# Patient Record
Sex: Female | Born: 1970 | Race: White | Hispanic: No | Marital: Married | Smoking: Never smoker
Health system: Southern US, Community
[De-identification: ages and names within clinical notes are randomized; demographics above are authoritative.]

## PROBLEM LIST (undated history)

## (undated) DIAGNOSIS — R5383 Other fatigue: Secondary | ICD-10-CM

## (undated) DIAGNOSIS — C801 Malignant (primary) neoplasm, unspecified: Secondary | ICD-10-CM

## (undated) DIAGNOSIS — I10 Essential (primary) hypertension: Secondary | ICD-10-CM

## (undated) DIAGNOSIS — F419 Anxiety disorder, unspecified: Secondary | ICD-10-CM

## (undated) DIAGNOSIS — N6009 Solitary cyst of unspecified breast: Secondary | ICD-10-CM

## (undated) HISTORY — PX: BLADDER SUSPENSION: SHX72

## (undated) HISTORY — PX: BREAST BIOPSY: SHX20

## (undated) HISTORY — PX: BREAST CYST ASPIRATION: SHX578

## (undated) HISTORY — PX: NOVASURE ABLATION: SHX5394

## (undated) HISTORY — DX: Other fatigue: R53.83

## (undated) HISTORY — DX: Solitary cyst of unspecified breast: N60.09

## (undated) HISTORY — DX: Essential (primary) hypertension: I10

---

## 2008-03-08 ENCOUNTER — Ambulatory Visit: Payer: Self-pay | Admitting: Obstetrics and Gynecology

## 2008-10-19 ENCOUNTER — Ambulatory Visit: Payer: Self-pay | Admitting: Obstetrics and Gynecology

## 2008-10-26 ENCOUNTER — Ambulatory Visit: Payer: Self-pay | Admitting: Obstetrics and Gynecology

## 2012-12-15 ENCOUNTER — Ambulatory Visit: Payer: Self-pay | Admitting: Obstetrics and Gynecology

## 2012-12-21 ENCOUNTER — Ambulatory Visit: Payer: Self-pay | Admitting: Obstetrics and Gynecology

## 2013-02-02 ENCOUNTER — Encounter: Payer: Self-pay | Admitting: General Surgery

## 2013-02-02 ENCOUNTER — Ambulatory Visit (INDEPENDENT_AMBULATORY_CARE_PROVIDER_SITE_OTHER): Payer: 59 | Admitting: General Surgery

## 2013-02-02 ENCOUNTER — Other Ambulatory Visit: Payer: Self-pay

## 2013-02-02 VITALS — BP 130/84 | HR 78 | Resp 14 | Ht 68.0 in | Wt 164.0 lb

## 2013-02-02 DIAGNOSIS — N6001 Solitary cyst of right breast: Secondary | ICD-10-CM

## 2013-02-02 DIAGNOSIS — N6009 Solitary cyst of unspecified breast: Secondary | ICD-10-CM | POA: Insufficient documentation

## 2013-02-02 NOTE — Patient Instructions (Signed)
Patient to return in 6 months for follow up.

## 2013-02-02 NOTE — Progress Notes (Signed)
Patient ID: Grace Moreno, female   DOB: 08-May-1971, 42 y.o.   MRN: [id number]  Chief Complaint  Patient presents with  . abnormal mammogram    New patient category 3     HPI Grace Moreno is a 42 y.o. female who presents as a new patient follow up of an abnormal mammogram. Initial mammogram was done on 12/15/12 with a birad category 0. Additional views of the right breast as well as an ultrasound was done on 12/21/12. Additional views had a birad category 3. She has a family history of breast and ovarian cancer. Patient admits regular self breast checks and has just had her second mammogram done. She denies any problems with her breasts at this time. She has had breast cysts in the the right breast in the past which were aspirated. The patient reports that these lesions were palpable. HPI  Past Medical History  Diagnosis Date  . Breast cyst   . Fatigue   . Hypertension     Past Surgical History  Procedure Laterality Date  . Cesarean section  2000    Family History  Problem Relation Age of Onset  . Lung cancer Father   . Cancer Paternal Aunt     breast  . Cancer Paternal Grandmother     breast  . Cancer Maternal Grandmother     ovarian    Social History History  Substance Use Topics  . Smoking status: Never Smoker   . Smokeless tobacco: Never Used  . Alcohol Use: Yes    No Known Allergies  Current Outpatient Prescriptions  Medication Sig Dispense Refill  . Biotin (BIOTIN 5000) 5 MG CAPS Take by mouth.      . cholecalciferol (VITAMIN D) 1000 UNITS tablet Take 1,000 Units by mouth daily.      . hydrochlorothiazide (HYDRODIURIL) 25 MG tablet Take 25 mg by mouth.      . Multiple Vitamin (MULTIVITAMIN) tablet Take 1 tablet by mouth daily.       No current facility-administered medications for this visit.    Review of Systems Review of Systems  Constitutional: Negative.   Respiratory: Negative.   Cardiovascular: Negative.     Blood pressure 130/84, pulse 78, resp. rate  14, height 5\' 8"  (1.727 m), weight 164 lb (74.39 kg), last menstrual period 01/21/2013.  Physical Exam Physical Exam  Constitutional: She appears well-developed and well-nourished.  Neck: Trachea normal. No mass and no thyromegaly present.  Cardiovascular: Normal rate, regular rhythm, normal heart sounds and normal pulses.   No murmur heard. Pulmonary/Chest: Effort normal and breath sounds normal. Right breast exhibits no inverted nipple, no mass, no nipple discharge, no skin change and no tenderness. Left breast exhibits no inverted nipple, no mass, no nipple discharge, no skin change and no tenderness.  Lymphadenopathy:    She has no cervical adenopathy.    She has no axillary adenopathy.    Data Reviewed Bilateral mammograms date December 15, 2012 showed an asymmetric density in the superior lateral portion of the right breast for which additional views were requested. BI-RAD-0.  Focal spot compression views on December 21, 2012 showed an indeterminate hypoechoic nodules 1:00 position that was smaller than the area noted on mammography. Internal echoes with redundant 5 and a consideration of a complex cyst was to be considered. BI-RAD-3.  Ultrasound examination of the right breast in the lateral aspect was completed. A 0.37 x 0.3 x 0.46 hypoechoic area that was too small to characterize was identified at the o'clock position  3 cm from the nipple. The patient was amenable to FNA sampling. This was completed using 1 cc of 1% plain Xylocaine. Multiple passes through the lesion showed complete resolution and a small amount of fairly clear fluid. Slides were prepared for cytology.    Assessment    Complex cyst right breast.     Plan    Arrangements will be made for a follow up exam in office ultrasound in 6 months.        Earline Mayotte 02/04/2013, 9:29 AM

## 2013-02-04 ENCOUNTER — Encounter: Payer: Self-pay | Admitting: General Surgery

## 2013-02-06 LAB — FINE-NEEDLE ASPIRATION

## 2013-02-07 ENCOUNTER — Telehealth: Payer: Self-pay | Admitting: *Deleted

## 2013-02-07 NOTE — Telephone Encounter (Signed)
Message copied by Currie Paris on Tue Feb 07, 2013  8:30 AM ------      Message from: Grace Moreno      Created: Tue Feb 07, 2013  7:56 AM       Notify patient cytology was as expected: cyst.  F/U in six months as discussed.  ------

## 2013-02-07 NOTE — Progress Notes (Signed)
Quick Note:  Cytology confirms clinical suspicion of a complex cyst. Arrangements in place for a follow up exam and ultrasound in six months. ______

## 2013-02-07 NOTE — Telephone Encounter (Signed)
Notified patient as instructed, patient pleased. Discussed follow-up appointments, patient agrees  

## 2013-06-26 ENCOUNTER — Ambulatory Visit: Payer: Self-pay | Admitting: Obstetrics and Gynecology

## 2013-08-07 ENCOUNTER — Encounter: Payer: Self-pay | Admitting: General Surgery

## 2013-08-07 ENCOUNTER — Ambulatory Visit (INDEPENDENT_AMBULATORY_CARE_PROVIDER_SITE_OTHER): Payer: 59 | Admitting: General Surgery

## 2013-08-07 VITALS — BP 130/80 | HR 76 | Resp 12 | Ht 68.0 in | Wt 176.0 lb

## 2013-08-07 DIAGNOSIS — N6001 Solitary cyst of right breast: Secondary | ICD-10-CM

## 2013-08-07 DIAGNOSIS — N6009 Solitary cyst of unspecified breast: Secondary | ICD-10-CM

## 2013-08-07 NOTE — Progress Notes (Signed)
Patient ID: Grace Moreno, female   DOB: 07-May-1971, 42 y.o.   MRN: [id number]  Chief Complaint  Patient presents with  . Follow-up    mammogram    HPI Grace Moreno is a 42 y.o. female who presents for a breast evaluation 06/26/13 with a birad category 3. She also had a right breast ultrasound done at the same time with a category 3 as well. The most recent mammogram was done on June 25, 2013 . Patient does perform regular self breast checks and gets regular mammograms done. The patient denies any new breast problems at this time.    At the time of her May 2014 exam plans were for a follow up ultrasound at this visit. The patient report she was contacted by [facility] and informed that she needed to have a mammogram and ultrasound completed at their facility, and their facility only. HPI  Past Medical History  Diagnosis Date  . Breast cyst   . Fatigue   . Hypertension     Past Surgical History  Procedure Laterality Date  . Cesarean section  2000    Family History  Problem Relation Age of Onset  . Lung cancer Father   . Cancer Paternal Aunt     breast  . Cancer Paternal Grandmother     breast  . Cancer Maternal Grandmother     ovarian    Social History History  Substance Use Topics  . Smoking status: Never Smoker   . Smokeless tobacco: Never Used  . Alcohol Use: Yes    No Known Allergies  Current Outpatient Prescriptions  Medication Sig Dispense Refill  . Biotin (BIOTIN 5000) 5 MG CAPS Take by mouth.      . cholecalciferol (VITAMIN D) 1000 UNITS tablet Take 1,000 Units by mouth daily.      . hydrochlorothiazide (HYDRODIURIL) 25 MG tablet Take 25 mg by mouth.      . Multiple Vitamin (MULTIVITAMIN) tablet Take 1 tablet by mouth daily.       No current facility-administered medications for this visit.    Review of Systems Review of Systems  Constitutional: Negative.   Respiratory: Negative.   Cardiovascular: Negative.     Blood pressure 130/80,  pulse 76, resp. rate 12, height 5\' 8"  (1.727 m), weight 176 lb (79.833 kg), last menstrual period 08/06/2013.  Physical Exam Physical Exam  Constitutional: She is oriented to person, place, and time. She appears well-developed and well-nourished.  Neck: Neck supple. No thyromegaly present.  Cardiovascular: Normal rate, regular rhythm and normal heart sounds.   No murmur heard. Pulmonary/Chest: Effort normal and breath sounds normal. Right breast exhibits no inverted nipple, no mass, no nipple discharge, no skin change and no tenderness. Left breast exhibits no inverted nipple, no mass, no nipple discharge, no skin change and no tenderness.  Lymphadenopathy:    She has no cervical adenopathy.    She has no axillary adenopathy.  Neurological: She is alert and oriented to person, place, and time.  Skin: Skin is warm and dry.    Data Reviewed  Right breast mammogram dated June 26, 2013 identified stable asymmetries within the right breast. Ultrasound showed a 6 mm oval hypoechoic area unchanged from previous exams.   BI-RAD-3.   Previous office ultrasound had showed an indeterminate lesion, which resolved completely on aspiration.cytology results are noted below.   Notes Recorded by Earline Mayotte, MD on 02/07/2013 at 7:56 AM Cytology confirms clinical suspicion of a complex cyst. Arrangements in place  for a follow up exam and ultrasound in six months.      80mo ago    Specimen type: Comment     Comments: [id number] Fine Needle Aspiration No. of Containers = 2   Source: Comment     Comments: RIGHT BREAST   PATH REPORT.FINAL DX SPEC Comment     Comments: 610.0 ; Solitary cyst of breast   DIAGNOSIS: Comment     Comments: RIGHT BREAST NEGATIVE FOR MALIGNANT CELLS. FOAM CELLS ARE PRESENT. CELLULAR DEGENERATION IS PRESENT. BENIGN CYST FLUID.   PATH REPORT.COMMENTS IMP SPEC Comment     Comments: Suggest follow up as clinically appropriate.   Signed out by: Comment     Comments:  Rod Mae, MD, Pathologist      Assessment    Multiple pressors, asymptomatic.     Plan    The patient should resume annual screening mammograms with her GYN in spring 2014. She was encouraged collar she has any concerns. Follow up otherwise will be on an as needed basis.        Earline Mayotte 08/07/2013, 9:47 PM

## 2013-08-07 NOTE — Patient Instructions (Addendum)
Patient to return as needed. Patient to call with any new questions or concerns.  

## 2013-12-26 ENCOUNTER — Ambulatory Visit: Payer: Self-pay | Admitting: Obstetrics and Gynecology

## 2014-07-23 ENCOUNTER — Encounter: Payer: Self-pay | Admitting: General Surgery

## 2015-02-19 ENCOUNTER — Other Ambulatory Visit: Payer: Self-pay | Admitting: Obstetrics and Gynecology

## 2015-02-19 DIAGNOSIS — N6310 Unspecified lump in the right breast, unspecified quadrant: Secondary | ICD-10-CM

## 2015-02-19 DIAGNOSIS — N6321 Unspecified lump in the left breast, upper outer quadrant: Secondary | ICD-10-CM

## 2015-02-19 DIAGNOSIS — N6315 Unspecified lump in the right breast, overlapping quadrants: Secondary | ICD-10-CM

## 2015-02-27 ENCOUNTER — Ambulatory Visit
Admission: RE | Admit: 2015-02-27 | Discharge: 2015-02-27 | Disposition: A | Discharge status: Home / Self Care | Payer: 59 | Source: Ambulatory Visit | Attending: Obstetrics and Gynecology | Admitting: Obstetrics and Gynecology

## 2015-02-27 DIAGNOSIS — N6321 Unspecified lump in the left breast, upper outer quadrant: Secondary | ICD-10-CM

## 2015-02-27 DIAGNOSIS — N6315 Unspecified lump in the right breast, overlapping quadrants: Secondary | ICD-10-CM

## 2015-02-27 DIAGNOSIS — N6310 Unspecified lump in the right breast, unspecified quadrant: Principal | ICD-10-CM

## 2015-02-27 DIAGNOSIS — N63 Unspecified lump in breast: Secondary | ICD-10-CM | POA: Diagnosis present

## 2015-10-25 ENCOUNTER — Other Ambulatory Visit: Payer: Self-pay | Admitting: Obstetrics and Gynecology

## 2015-10-25 DIAGNOSIS — N63 Unspecified lump in unspecified breast: Secondary | ICD-10-CM

## 2015-10-31 ENCOUNTER — Ambulatory Visit: Admission: RE | Admit: 2015-10-31 | Payer: 59 | Source: Ambulatory Visit

## 2015-10-31 ENCOUNTER — Other Ambulatory Visit: Payer: 59

## 2015-11-13 ENCOUNTER — Ambulatory Visit
Admission: RE | Admit: 2015-11-13 | Discharge: 2015-11-13 | Disposition: A | Discharge status: Home / Self Care | Payer: 59 | Source: Ambulatory Visit | Attending: Obstetrics and Gynecology | Admitting: Obstetrics and Gynecology

## 2015-11-13 ENCOUNTER — Other Ambulatory Visit: Payer: Self-pay | Admitting: Obstetrics and Gynecology

## 2015-11-13 DIAGNOSIS — N63 Unspecified lump in unspecified breast: Secondary | ICD-10-CM

## 2015-11-13 DIAGNOSIS — N6001 Solitary cyst of right breast: Secondary | ICD-10-CM | POA: Insufficient documentation

## 2016-02-21 ENCOUNTER — Other Ambulatory Visit: Payer: Self-pay | Admitting: Obstetrics and Gynecology

## 2016-02-21 DIAGNOSIS — N63 Unspecified lump in unspecified breast: Secondary | ICD-10-CM

## 2016-03-21 DIAGNOSIS — C539 Malignant neoplasm of cervix uteri, unspecified: Secondary | ICD-10-CM

## 2016-03-21 HISTORY — DX: Malignant neoplasm of cervix uteri, unspecified: C53.9

## 2016-03-26 ENCOUNTER — Encounter
Admission: RE | Admit: 2016-03-26 | Discharge: 2016-03-26 | Disposition: A | Discharge status: Home / Self Care | Payer: 59 | Source: Ambulatory Visit | Attending: Obstetrics and Gynecology | Admitting: Obstetrics and Gynecology

## 2016-03-26 DIAGNOSIS — Z01812 Encounter for preprocedural laboratory examination: Secondary | ICD-10-CM | POA: Insufficient documentation

## 2016-03-26 DIAGNOSIS — I10 Essential (primary) hypertension: Secondary | ICD-10-CM

## 2016-03-26 DIAGNOSIS — Z01810 Encounter for preprocedural cardiovascular examination: Secondary | ICD-10-CM | POA: Diagnosis present

## 2016-03-26 HISTORY — DX: Anxiety disorder, unspecified: F41.9

## 2016-03-26 LAB — BASIC METABOLIC PANEL
ANION GAP: 7 (ref 5–15)
BUN: 14 mg/dL (ref 6–20)
CALCIUM: 9.6 mg/dL (ref 8.9–10.3)
CHLORIDE: 103 mmol/L (ref 101–111)
CO2: 27 mmol/L (ref 22–32)
CREATININE: 0.73 mg/dL (ref 0.44–1.00)
GFR calc non Af Amer: >60 mL/min (ref >60)
Glucose, Bld: 90 mg/dL (ref 65–99)
Potassium: 3.7 mmol/L (ref 3.5–5.1)
SODIUM: 137 mmol/L (ref 135–145)

## 2016-03-26 LAB — CBC
HCT: 40.1 % (ref 35.0–47.0)
HEMOGLOBIN: 13.6 g/dL (ref 12.0–16.0)
MCH: 30.3 pg (ref 26.0–34.0)
MCHC: 34 g/dL (ref 32.0–36.0)
MCV: 89 fL (ref 80.0–100.0)
PLATELETS: 237 10*3/uL (ref 150–440)
RBC: 4.51 MIL/uL (ref 3.80–5.20)
RDW: 12.7 % (ref 11.5–14.5)
WBC: 7.2 10*3/uL (ref 3.6–11.0)

## 2016-03-26 NOTE — Patient Instructions (Signed)
  Your procedure is scheduled on: April 20, 2016 (Monday) Report to Same Day Surgery.[facility]  To find out your arrival time please call [phone] between 1PM - 3PM on April 17, 2016 (Friday).  Remember: Instructions that are not followed completely may result in serious medical risk, up to and including death, or upon the discretion of your surgeon and anesthesiologist your surgery may need to be rescheduled.    __x__ 1. Do not eat food or drink liquids after midnight. No gum chewing or hard candies.     __x_ 2. No Alcohol for 24 hours before or after surgery.   __x__ 3. Do Not Smoke For 24 Hours Prior to Your Surgery.   ____ 4. Bring all medications with you on the day of surgery if instructed.    __x_ 5. Notify your doctor if there is any change in your medical condition     (cold, fever, infections).       Do not wear jewelry, make-up, hairpins, clips or nail polish.  Do not wear lotions, powders, or perfumes. You may wear deodorant.  Do not shave 48 hours prior to surgery. Men may shave face and neck.  Do not bring valuables to the hospital.    [facility] is not responsible for any belongings or valuables.               Contacts, dentures or bridgework may not be worn into surgery.  Leave your suitcase in the car. After surgery it may be brought to your room.  For patients admitted to the hospital, discharge time is determined by your                treatment team.   Patients discharged the day of surgery will not be allowed to drive home.   Please read over the following fact sheets that you were given:   Surgical Site Infection Prevention   ____ Take these medicines the morning of surgery with A SIP OF WATER:    1.   2.   3.   4.  5.  6.  ____ Fleet Enema (as directed)   _x___ Use CHG Soap as directed  ____ Use inhalers on the day of surgery  ____ Stop metformin 2 days prior to surgery    ____ Take 1/2 of usual insulin dose the night  before surgery and none on the morning of surgery.   _x___ Stop Coumadin/Plavix/aspirin on (NO ASPIRIN)  _x___ Stop Anti-inflammatories on (NO NSAIDS, OR ASPIRIN PRODUCTS) TYLENOL OK TO TAKE FOR PAIN IF NEEDED   _x Stop supplements until after surgery. (STOP BIOTIN AND VITAMIN B 12  TWO WEEKS PRIOR TO SURGERY)  ____ Bring C-Pap to the hospital.

## 2016-03-26 NOTE — H&P (Signed)
Chief Complaint:    Patient ID: Grace Moreno is a 45 y.o. female presenting with Follow-up  on 03/11/2016  HPI:  Presenting for preoperative visit  in anticipation of hysterectomy for persistent abnormal pap smears, abnormal uterine bleeding.  EMBx 03/23/16- ENDOMETRIUM, BIOPSY:  MINUTE FRAGMENTS OF BENIGN ENDOCERVICAL GLANDS, MUCUS, AND BLOOD. NO ENDOMETRIAL TISSUE IDENTIFIED.   LMP: 01/02/2016 (exact date)  2016: LGSIL with +HPV  2017 neg with +HPV --> colposcopy with neg ECC, but CIN II (p16 stain pos) at 1 o'clock. She returns for discussion of excisional procedure.  She has a hx of LGSIL with +HPV last year with neg colpo bx, neg cytology but pos HPV this year and CIN II on biopsy with neg ECC as below.   : Part A: CERVIX, BIOPSY, 12:00:  KOILOCYTOSIS SUGGESTIVE OF HUMAN PAPILLOMAVIRUS (HPV) EFFECT.  Part B: CERVIX, BIOPSY, 1:00:  HIGH-GRADE SQUAMOUS INTRAEPITHELIAL LESION (CIN 2).  NOTE: Based on initial morphologic review, immunohistochemical stains  were ordered to rule in or rule out high grade dysplasia. A p16 stain is  strongly and diffusely positive, supporting the diagnosis.  Part C:  ENDOCERVIX, CURETTINGS: STRIPS OF SQUAMOUS EPITHELIUM WITH A LOW GRADE  SQUAMOUS INTRAEPITHELIAL LESION (CIN 1). MINUTE FRAGMENTS OF BENIGN  ENDOCERVICAL GLANDS, MUCUS, AND BLOOD.   Hx of BTL, C/S and NSVD, endometrial ablation, and anterior repair. She has been having prolapse and is a weight lifter.   Past Medical History:  has a past medical history of Anxiety, unspecified; Hypertension; and Migraines.  Past Surgical History:  has a past surgical history that includes Tubal ligation; Cesarean section; Bladder SUSPENSION SURGERY; Thermal Ablation Endometrium; and Bladder surgery. Family History: family history includes Breast cancer in her paternal aunt and paternal grandmother; COPD in her mother; Colon cancer in her maternal grandmother; Diabetes mellitus in her  maternal aunt; Hernia in her brother; Hypertension in her brother; Lung cancer in her father; Melanoma in her paternal aunt; Stroke in her father. Social History:  reports that she has never smoked. She has never used smokeless tobacco. She reports that she drinks alcohol. She reports that she does not use illicit drugs. OB/GYN History:  OB History    Gravida Para Term Preterm AB TAB SAB Ectopic Multiple Living   3 3 3       3       Allergies: is allergic to pramipexole. Medications:  Current Outpatient Prescriptions:  .  ALPRAZolam (XANAX) 0.5 MG tablet, Take 0.5 mg by mouth as needed., Disp: , Rfl: 5 .  biotin 2,500 mcg Tab, Take 5,000 mcg by mouth once daily., Disp: , Rfl:  .  cholecalciferol (CHOLECALCIFEROL) 1,000 unit tablet, Take 1,000 Units by mouth once daily.  , Disp: , Rfl:  .  cyanocobalamin (VITAMIN B12) 1000 MCG tablet, Take 1,000 mcg by mouth once daily., Disp: , Rfl:  .  losartan-hydrochlorothiazide (HYZAAR) 50-12.5 mg tablet, Take 1 tablet by mouth once daily., Disp: 90 tablet, Rfl: 1 .  multivitamin (MULTIVITAMIN) tablet, Take 1 tablet by mouth once daily.  , Disp: , Rfl:  .  sertraline (ZOLOFT) 100 MG tablet, Take 1 tablet (100 mg total) by mouth once daily., Disp: 90 tablet, Rfl: 1 .  sertraline (ZOLOFT) 50 MG tablet, Take 1 tablet by mouth once daily, Disp: 90 tablet, Rfl: 1   Review of Systems: No SOB, no palpitations or chest pain, no new lower extremity edema, no nausea or vomiting or bowel or bladder complaints. See HPI for gyn specific ROS.  Exam:     BP (!) 148/93  Pulse 64  Wt 75.8 kg (167 lb)  LMP 01/02/2016 (Exact Date)  BMI 26.55 kg/m2  General: Patient is well-groomed, well-nourished, appears stated age in no acute distress  HEENT: head is atraumatic and normocephalic, trachea is midline, neck is supple with no palpable nodules  CV: Regular rhythm and normal heart rate, no murmur  Pulm: Clear to auscultation throughout lung  fields with no wheezing, crackles, or rhonchi. 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 Diagnoses of Cervical high risk HPV (human papillomavirus) test positive and Pre-op testing were also pertinent to this visit.    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   Her endometrial biopsy was unsuccessful due to cervical stenosis (dilation did occur and entry into the uterus appeared to be successful at the time) and minimal tissue, I am assuming. I did discuss with the patient my low suspicion of endometrial abnormalities based on her lack of sx and her young age and low risk status. She is aware that if endometrial abnormality is found by pathology, she may - but may not - require further staging surgeries. She understands and accepts this risk rather than repeat an EMBx in the office or in the OR.  The patient and I discussed the technical aspects of the procedure including the potential for risks and complications. These include but are not limited to the risk of infection requiring post-operative antibiotics or further  procedures. We talked about the risk of injury to adjacent organs including bladder, bowel, ureter, blood vessels or nerves. We talked about the need to convert to an open incision. We talked about the possible need for blood transfusion. We talked aboutpostop complications such asthromboembolic or cardiopulmonary complications. All of her questions were answered.  Her preoperative exam was completed and the appropriate consents were signed. She is scheduled to undergo this procedure in the near future.  Specific Peri-operative Considerations:  - Consent:  obtained today - Health Maintenance: up to date - Labs: CBC, CMP preoperatively - Studies: EKG, CXR preoperatively - Bowel Preparation: None required - Abx:  Cefoxitin 2g - VTE ppx: SCDs perioperatively - Glucose Protocol: n/a - Beta-blockade: n/a

## 2016-03-27 LAB — TYPE AND SCREEN
ABO/RH(D): O POS
Antibody Screen: NEGATIVE

## 2016-03-30 NOTE — Pre-Procedure Instructions (Signed)
Dr. Laureen Abrahams made aware of EKG and stated ok for surgery.

## 2016-04-19 MED ORDER — CEFAZOLIN SODIUM-DEXTROSE 2-4 GM/100ML-% IV SOLN
2.0000 g | INTRAVENOUS | Status: AC
  Administered 2016-04-20: 2000 mg via INTRAVENOUS

## 2016-04-20 ENCOUNTER — Observation Stay
Admission: RE | Admit: 2016-04-20 | Discharge: 2016-04-21 | Disposition: A | Discharge status: Home / Self Care | Payer: 59 | Source: Ambulatory Visit | Attending: Obstetrics and Gynecology | Admitting: Obstetrics and Gynecology

## 2016-04-20 ENCOUNTER — Encounter
Admission: RE | Disposition: A | Discharge status: Home / Self Care | Payer: Self-pay | Source: Ambulatory Visit | Attending: Obstetrics and Gynecology

## 2016-04-20 ENCOUNTER — Ambulatory Visit: Payer: 59 | Admitting: Anesthesiology

## 2016-04-20 DIAGNOSIS — Z888 Allergy status to other drugs, medicaments and biological substances status: Secondary | ICD-10-CM | POA: Diagnosis not present

## 2016-04-20 DIAGNOSIS — Z803 Family history of malignant neoplasm of breast: Secondary | ICD-10-CM | POA: Insufficient documentation

## 2016-04-20 DIAGNOSIS — D251 Intramural leiomyoma of uterus: Secondary | ICD-10-CM | POA: Insufficient documentation

## 2016-04-20 DIAGNOSIS — Z9889 Other specified postprocedural states: Secondary | ICD-10-CM | POA: Insufficient documentation

## 2016-04-20 DIAGNOSIS — Z825 Family history of asthma and other chronic lower respiratory diseases: Secondary | ICD-10-CM | POA: Diagnosis not present

## 2016-04-20 DIAGNOSIS — Z833 Family history of diabetes mellitus: Secondary | ICD-10-CM | POA: Insufficient documentation

## 2016-04-20 DIAGNOSIS — N871 Moderate cervical dysplasia: Secondary | ICD-10-CM | POA: Diagnosis present

## 2016-04-20 DIAGNOSIS — D060 Carcinoma in situ of endocervix: Secondary | ICD-10-CM | POA: Diagnosis not present

## 2016-04-20 DIAGNOSIS — Z801 Family history of malignant neoplasm of trachea, bronchus and lung: Secondary | ICD-10-CM | POA: Insufficient documentation

## 2016-04-20 DIAGNOSIS — Z800 Family history of malignant neoplasm of digestive organs: Secondary | ICD-10-CM | POA: Insufficient documentation

## 2016-04-20 DIAGNOSIS — N819 Female genital prolapse, unspecified: Secondary | ICD-10-CM | POA: Diagnosis present

## 2016-04-20 DIAGNOSIS — Z9851 Tubal ligation status: Secondary | ICD-10-CM | POA: Insufficient documentation

## 2016-04-20 DIAGNOSIS — Z808 Family history of malignant neoplasm of other organs or systems: Secondary | ICD-10-CM | POA: Diagnosis not present

## 2016-04-20 DIAGNOSIS — N8500 Endometrial hyperplasia, unspecified: Secondary | ICD-10-CM | POA: Diagnosis not present

## 2016-04-20 DIAGNOSIS — Z823 Family history of stroke: Secondary | ICD-10-CM | POA: Insufficient documentation

## 2016-04-20 DIAGNOSIS — I10 Essential (primary) hypertension: Secondary | ICD-10-CM | POA: Diagnosis not present

## 2016-04-20 DIAGNOSIS — F419 Anxiety disorder, unspecified: Secondary | ICD-10-CM | POA: Insufficient documentation

## 2016-04-20 HISTORY — PX: CYSTOCELE REPAIR: SHX163

## 2016-04-20 HISTORY — PX: VAGINAL HYSTERECTOMY: SHX2639

## 2016-04-20 HISTORY — PX: ABDOMINAL HYSTERECTOMY: SHX81

## 2016-04-20 LAB — POCT PREGNANCY, URINE: PREG TEST UR: NEGATIVE

## 2016-04-20 LAB — TYPE AND SCREEN
ABO/RH(D): O POS
ANTIBODY SCREEN: NEGATIVE

## 2016-04-20 SURGERY — HYSTERECTOMY, VAGINAL
Anesthesia: General

## 2016-04-20 MED ORDER — FAMOTIDINE 20 MG PO TABS
ORAL_TABLET | ORAL | Status: AC
  Administered 2016-04-20: 20 mg via ORAL
  Filled 2016-04-20: qty 1

## 2016-04-20 MED ORDER — LIDOCAINE-EPINEPHRINE 1 %-1:100000 IJ SOLN
INTRAMUSCULAR | Status: AC
  Filled 2016-04-20: qty 1

## 2016-04-20 MED ORDER — ONDANSETRON HCL 4 MG/2ML IJ SOLN
INTRAMUSCULAR | Status: DC | PRN
  Administered 2016-04-20: 4 mg via INTRAVENOUS

## 2016-04-20 MED ORDER — MENTHOL 3 MG MT LOZG
1.0000 | LOZENGE | OROMUCOSAL | Status: DC | PRN
  Filled 2016-04-20: qty 9

## 2016-04-20 MED ORDER — OXYCODONE HCL 5 MG/5ML PO SOLN: 5.0000 mg | Freq: Once | ORAL | Status: DC | PRN

## 2016-04-20 MED ORDER — IBUPROFEN 600 MG PO TABS
600.0000 mg | ORAL_TABLET | Freq: Four times a day (QID) | ORAL | Status: DC | PRN
  Administered 2016-04-21 (×2): 600 mg via ORAL
  Filled 2016-04-20 (×2): qty 1

## 2016-04-20 MED ORDER — ADULT MULTIVITAMIN W/MINERALS CH
1.0000 | ORAL_TABLET | Freq: Every day | ORAL | Status: DC
  Filled 2016-04-20 (×2): qty 1

## 2016-04-20 MED ORDER — PHENYLEPHRINE HCL 10 MG/ML IJ SOLN
INTRAMUSCULAR | Status: DC | PRN
  Administered 2016-04-20: 100 ug via INTRAVENOUS
  Administered 2016-04-20: 50 ug via INTRAVENOUS
  Administered 2016-04-20: 100 ug via INTRAVENOUS

## 2016-04-20 MED ORDER — HYDROCHLOROTHIAZIDE 12.5 MG PO CAPS
12.5000 mg | ORAL_CAPSULE | Freq: Every day | ORAL | Status: DC
  Filled 2016-04-20: qty 1

## 2016-04-20 MED ORDER — PROPOFOL 10 MG/ML IV BOLUS
INTRAVENOUS | Status: DC | PRN
  Administered 2016-04-20: 150 ug via INTRAVENOUS

## 2016-04-20 MED ORDER — LACTATED RINGERS IV SOLN
INTRAVENOUS | Status: DC
  Administered 2016-04-20 (×2): via INTRAVENOUS

## 2016-04-20 MED ORDER — SUGAMMADEX SODIUM 200 MG/2ML IV SOLN
INTRAVENOUS | Status: DC | PRN
  Administered 2016-04-20: 80 mg via INTRAVENOUS

## 2016-04-20 MED ORDER — FAMOTIDINE 20 MG PO TABS
20.0000 mg | ORAL_TABLET | Freq: Once | ORAL | Status: AC
  Administered 2016-04-20: 20 mg via ORAL

## 2016-04-20 MED ORDER — DOCUSATE SODIUM 100 MG PO CAPS
100.0000 mg | ORAL_CAPSULE | Freq: Two times a day (BID) | ORAL | Status: DC
  Administered 2016-04-20 – 2016-04-21 (×2): 100 mg via ORAL
  Filled 2016-04-20 (×3): qty 1

## 2016-04-20 MED ORDER — ONDANSETRON HCL 4 MG/2ML IJ SOLN: 4.0000 mg | Freq: Four times a day (QID) | INTRAMUSCULAR | Status: DC | PRN

## 2016-04-20 MED ORDER — MIDAZOLAM HCL 2 MG/2ML IJ SOLN
INTRAMUSCULAR | Status: DC | PRN
  Administered 2016-04-20: 2 mg via INTRAVENOUS

## 2016-04-20 MED ORDER — HYDROMORPHONE HCL 1 MG/ML IJ SOLN
0.2000 mg | INTRAMUSCULAR | Status: DC | PRN
  Administered 2016-04-20: 0.4 mg via INTRAVENOUS
  Administered 2016-04-20: 0.6 mg via INTRAVENOUS
  Administered 2016-04-20: 0.4 mg via INTRAVENOUS
  Administered 2016-04-20 – 2016-04-21 (×5): 0.6 mg via INTRAVENOUS
  Filled 2016-04-20 (×8): qty 1

## 2016-04-20 MED ORDER — LOSARTAN POTASSIUM-HCTZ 50-12.5 MG PO TABS: 1.0000 | ORAL_TABLET | Freq: Every day | ORAL | Status: DC

## 2016-04-20 MED ORDER — FENTANYL CITRATE (PF) 250 MCG/5ML IJ SOLN
INTRAMUSCULAR | Status: DC | PRN
  Administered 2016-04-20: 150 ug via INTRAVENOUS

## 2016-04-20 MED ORDER — LIDOCAINE-EPINEPHRINE 1 %-1:100000 IJ SOLN
INTRAMUSCULAR | Status: DC | PRN
  Administered 2016-04-20: 26 mL

## 2016-04-20 MED ORDER — HYDROMORPHONE HCL 1 MG/ML IJ SOLN
INTRAMUSCULAR | Status: DC | PRN
  Administered 2016-04-20 (×2): 0.5 mg via INTRAVENOUS

## 2016-04-20 MED ORDER — ESTROGENS, CONJUGATED 0.625 MG/GM VA CREA
TOPICAL_CREAM | VAGINAL | Status: DC | PRN
  Administered 2016-04-20: 1 via VAGINAL

## 2016-04-20 MED ORDER — LACTATED RINGERS IV SOLN
INTRAVENOUS | Status: DC
  Administered 2016-04-20 – 2016-04-21 (×3): via INTRAVENOUS

## 2016-04-20 MED ORDER — MEPERIDINE HCL 25 MG/ML IJ SOLN: 6.2500 mg | INTRAMUSCULAR | Status: DC | PRN

## 2016-04-20 MED ORDER — LIDOCAINE HCL (CARDIAC) 20 MG/ML IV SOLN
INTRAVENOUS | Status: DC | PRN
  Administered 2016-04-20: 80 mg via INTRATRACHEAL

## 2016-04-20 MED ORDER — DEXAMETHASONE SODIUM PHOSPHATE 10 MG/ML IJ SOLN
INTRAMUSCULAR | Status: DC | PRN
  Administered 2016-04-20: 4 mg via INTRAVENOUS

## 2016-04-20 MED ORDER — ESTROGENS, CONJUGATED 0.625 MG/GM VA CREA
TOPICAL_CREAM | VAGINAL | Status: AC
  Filled 2016-04-20: qty 30

## 2016-04-20 MED ORDER — SERTRALINE HCL 50 MG PO TABS
100.0000 mg | ORAL_TABLET | Freq: Every day | ORAL | Status: DC
  Administered 2016-04-21: 100 mg via ORAL
  Filled 2016-04-20 (×2): qty 2

## 2016-04-20 MED ORDER — OXYCODONE HCL 5 MG PO TABS: 5.0000 mg | ORAL_TABLET | Freq: Once | ORAL | Status: DC | PRN

## 2016-04-20 MED ORDER — BIOTIN 5 MG PO CAPS: 10.0000 mg | ORAL_CAPSULE | Freq: Every day | ORAL | Status: DC

## 2016-04-20 MED ORDER — ONDANSETRON HCL 4 MG PO TABS: 4.0000 mg | ORAL_TABLET | Freq: Four times a day (QID) | ORAL | Status: DC | PRN

## 2016-04-20 MED ORDER — OXYCODONE-ACETAMINOPHEN 5-325 MG PO TABS
1.0000 | ORAL_TABLET | ORAL | Status: DC | PRN
  Administered 2016-04-21 (×2): 2 via ORAL
  Filled 2016-04-20 (×2): qty 2

## 2016-04-20 MED ORDER — KETOROLAC TROMETHAMINE 30 MG/ML IJ SOLN
30.0000 mg | Freq: Once | INTRAMUSCULAR | Status: AC
  Administered 2016-04-20: 30 mg via INTRAVENOUS
  Filled 2016-04-20: qty 1

## 2016-04-20 MED ORDER — ROCURONIUM BROMIDE 100 MG/10ML IV SOLN
INTRAVENOUS | Status: DC | PRN
  Administered 2016-04-20: 10 mg via INTRAVENOUS
  Administered 2016-04-20: 40 mg via INTRAVENOUS
  Administered 2016-04-20: 10 mg via INTRAVENOUS
  Administered 2016-04-20: 5 mg via INTRAVENOUS

## 2016-04-20 MED ORDER — PROMETHAZINE HCL 25 MG/ML IJ SOLN: 6.2500 mg | INTRAMUSCULAR | Status: DC | PRN

## 2016-04-20 MED ORDER — CEFAZOLIN SODIUM-DEXTROSE 2-4 GM/100ML-% IV SOLN
INTRAVENOUS | Status: AC
  Administered 2016-04-20: 2000 mg via INTRAVENOUS
  Filled 2016-04-20: qty 100

## 2016-04-20 MED ORDER — LOSARTAN POTASSIUM 50 MG PO TABS
50.0000 mg | ORAL_TABLET | Freq: Every day | ORAL | Status: DC
  Filled 2016-04-20: qty 1

## 2016-04-20 MED ORDER — FENTANYL CITRATE (PF) 100 MCG/2ML IJ SOLN
25.0000 ug | INTRAMUSCULAR | Status: DC | PRN
  Administered 2016-04-20 (×3): 50 ug via INTRAVENOUS

## 2016-04-20 SURGICAL SUPPLY — 41 items
BAG URO DRAIN 2000ML W/SPOUT (MISCELLANEOUS) ×2 IMPLANT
BNDG GAUZE 4.5X4.1 6PLY STRL (MISCELLANEOUS) ×2 IMPLANT
CANISTER SUCT 1200ML W/VALVE (MISCELLANEOUS) ×2 IMPLANT
CATH FOLEY 2WAY  5CC 16FR (CATHETERS) ×1
CATH ROBINSON RED A/P 16FR (CATHETERS) ×2 IMPLANT
CATH URTH 16FR FL 2W BLN LF (CATHETERS) ×1 IMPLANT
COUNTER NEEDLE 20/40 LG (NEEDLE) ×2 IMPLANT
DRAPE PERI LITHO V/GYN (MISCELLANEOUS) ×2 IMPLANT
DRAPE SURG 17X11 SM STRL (DRAPES) ×2 IMPLANT
DRAPE UNDER BUTTOCK W/FLU (DRAPES) ×2 IMPLANT
ELECT REM PT RETURN 9FT ADLT (ELECTROSURGICAL) ×2
ELECTRODE REM PT RTRN 9FT ADLT (ELECTROSURGICAL) ×1 IMPLANT
ETHIBOND 2 0 GREEN CT 2 30IN (SUTURE) IMPLANT
GAUZE PACK 2X3YD (MISCELLANEOUS) ×2 IMPLANT
GLOVE BIO SURGEON STRL SZ 6.5 (GLOVE) ×8 IMPLANT
GLOVE INDICATOR 7.0 STRL GRN (GLOVE) ×8 IMPLANT
GOWN STRL REUS W/ TWL LRG LVL3 (GOWN DISPOSABLE) ×3 IMPLANT
GOWN STRL REUS W/ TWL XL LVL3 (GOWN DISPOSABLE) ×1 IMPLANT
GOWN STRL REUS W/TWL LRG LVL3 (GOWN DISPOSABLE) ×3
GOWN STRL REUS W/TWL XL LVL3 (GOWN DISPOSABLE) ×1
KIT RM TURNOVER CYSTO AR (KITS) ×2 IMPLANT
LABEL OR SOLS (LABEL) ×2 IMPLANT
NDL SAFETY 22GX1.5 (NEEDLE) ×2 IMPLANT
NS IRRIG 500ML POUR BTL (IV SOLUTION) ×2 IMPLANT
PACK BASIN MINOR ARMC (MISCELLANEOUS) ×2 IMPLANT
PAD OB MATERNITY 4.3X12.25 (PERSONAL CARE ITEMS) ×2 IMPLANT
PAD PREP 24X41 OB/GYN DISP (PERSONAL CARE ITEMS) ×2 IMPLANT
SPONGE XRAY 4X4 16PLY STRL (MISCELLANEOUS) ×4 IMPLANT
SUT PDS 2-0 27IN (SUTURE) IMPLANT
SUT PDS AB 2-0 CT1 27 (SUTURE) IMPLANT
SUT VIC AB 0 CT1 27 (SUTURE) ×5
SUT VIC AB 0 CT1 27XCR 8 STRN (SUTURE) ×5 IMPLANT
SUT VIC AB 0 CT1 36 (SUTURE) ×12 IMPLANT
SUT VIC AB 2-0 CT1 36 (SUTURE) IMPLANT
SUT VIC AB 2-0 SH 27 (SUTURE) ×4
SUT VIC AB 2-0 SH 27XBRD (SUTURE) ×4 IMPLANT
SUT VIC AB 3-0 SH 27 (SUTURE)
SUT VIC AB 3-0 SH 27X BRD (SUTURE) IMPLANT
SYR CONTROL 10ML (SYRINGE) ×2 IMPLANT
SYRINGE 10CC LL (SYRINGE) ×2 IMPLANT
WATER STERILE IRR 1000ML POUR (IV SOLUTION) ×2 IMPLANT

## 2016-04-20 NOTE — Interval H&P Note (Signed)
History and Physical Interval Note:  04/20/2016 9:36 AM  Grace Moreno  has presented today for surgery, with the diagnosis of Perseistent CIN 2  POP  The various methods of treatment have been discussed with the patient and family. After consideration of risks, benefits and other options for treatment, the patient has consented to  Procedure(s): HYSTERECTOMY VAGINAL (N/A) ANTERIOR REPAIR (CYSTOCELE) (N/A) BILATERAL SALPINGECTOMY as a surgical intervention .  The patient's history has been reviewed, patient examined, no change in status, stable for surgery.  I have reviewed the patient's chart and labs.  Questions were answered to the patient's satisfaction.     Benjaman Kindler

## 2016-04-20 NOTE — Transfer of Care (Signed)
Immediate Anesthesia Transfer of Care Note  Patient: Grace Moreno  Procedure(s) Performed: Procedure(s): HYSTERECTOMY VAGINAL (N/A) ANTERIOR REPAIR (CYSTOCELE) (N/A)  Patient Location: PACU  Anesthesia Type:General  Level of Consciousness: awake and patient cooperative  Airway & Oxygen Therapy: Patient Spontanous Breathing  Post-op Assessment: Report given to RN, Post -op Vital signs reviewed and stable, Patient moving all extremities and Patient able to stick tongue midline  Post vital signs: Reviewed and stable  Last Vitals:  Vitals:   04/20/16 0930 04/20/16 1256  BP:    Pulse:  78  Resp:    Temp: 36.8 C     Last Pain:  Vitals:   04/20/16 1256  TempSrc: Temporal         Complications: No apparent anesthesia complications

## 2016-04-20 NOTE — Anesthesia Postprocedure Evaluation (Signed)
Anesthesia Post Note  Patient: Grace Moreno  Procedure(s) Performed: Procedure(s) (LRB): HYSTERECTOMY VAGINAL (N/A) ANTERIOR REPAIR (CYSTOCELE) (N/A)  Patient location during evaluation: PACU Anesthesia Type: General Level of consciousness: awake Pain management: pain level controlled Respiratory status: spontaneous breathing Cardiovascular status: blood pressure returned to baseline and stable Postop Assessment: no headache Anesthetic complications: no    Last Vitals:  Vitals:   04/20/16 0930 04/20/16 1256  BP:    Pulse:  78  Resp:    Temp: 36.8 C     Last Pain:  Vitals:   04/20/16 1256  TempSrc: Temporal                 Myrene Galas

## 2016-04-20 NOTE — Anesthesia Postprocedure Evaluation (Signed)
Anesthesia Post Note  Patient: Grace Moreno  Procedure(s) Performed: Procedure(s) (LRB): HYSTERECTOMY VAGINAL (N/A) ANTERIOR REPAIR (CYSTOCELE) (N/A)  Patient location during evaluation: PACU Anesthesia Type: General Level of consciousness: awake and alert and oriented Pain management: pain level controlled Vital Signs Assessment: post-procedure vital signs reviewed and stable Respiratory status: spontaneous breathing, nonlabored ventilation and respiratory function stable Cardiovascular status: blood pressure returned to baseline and stable Postop Assessment: no signs of nausea or vomiting Anesthetic complications: no    Last Vitals:  Vitals:   04/20/16 1345 04/20/16 1355  BP: 114/66 112/64  Pulse: 61 (!) 57  Resp: 10 12  Temp:  36.2 C    Last Pain:  Vitals:   04/20/16 1355  TempSrc:   PainSc: 3                  Nataniel Gasper

## 2016-04-20 NOTE — Transfer of Care (Signed)
Immediate Anesthesia Transfer of Care Note  Patient: Grace Moreno  Procedure(s) Performed: Procedure(s): HYSTERECTOMY VAGINAL (N/A) ANTERIOR REPAIR (CYSTOCELE) (N/A)  Patient Location: PACU  Anesthesia Type:General  Level of Consciousness: awake, patient cooperative and responds to stimulation  Airway & Oxygen Therapy: Patient Spontanous Breathing  Post-op Assessment: Report given to RN, Post -op Vital signs reviewed and stable, Patient moving all extremities and Patient able to stick tongue midline  Post vital signs: Reviewed and stable  Last Vitals:  Vitals:   04/20/16 0930 04/20/16 1256  BP:    Pulse:  78  Resp:    Temp: 36.8 C     Last Pain:  Vitals:   04/20/16 1256  TempSrc: Temporal         Complications: No apparent anesthesia complications

## 2016-04-20 NOTE — Progress Notes (Signed)

## 2016-04-20 NOTE — Op Note (Signed)
Grace Moreno PROCEDURE DATE: 04/20/2016  PREOPERATIVE DIAGNOSIS:   1. 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 Anterior pelvic organ prolapse  POSTOPERATIVE DIAGNOSIS:   Same  SURGEON:   Benjaman Kindler, M.D. ASSISTANT: Boykin Nearing, M.D. OPERATION:  Total Vaginal Hysterectomy, bilateral salpingectomy, anterior repair ANESTHESIA:  General endotracheal. Anesthesiologist: Anesthesiologist: Emmie Niemann, MD CRNA: Demetrius Charity, CRNA; Rockne Coons, CRNA  INDICATIONS: The patient is a 45 y.o. G3P3 with history of CIN II and persistent HPV, as well as anterior pelvic organ prolapse. The patient made a decision to undergo definite surgical treatment. On the preoperative visit, the risks, benefits, indications, and alternatives of the procedure were reviewed with the patient.  On the day of surgery, the risks of surgery were again discussed with the patient including but not limited to: bleeding which may require transfusion or reoperation; infection which may require antibiotics; injury to bowel, bladder, ureters or other surrounding organs; need for additional procedures; thromboembolic phenomenon, incisional problems and other postoperative/anesthesia complications. Written informed consent was obtained.    OPERATIVE FINDINGS: A 6 week size uterus with normal tubes and ovaries bilaterally.   ESTIMATED BLOOD LOSS: 150 ml FLUIDS:  1200 ml of Lactated Ringers URINE OUTPUT:  500 ml of clear yellow urine. SPECIMENS:  Uterus and cervix, tubes bilaterally sent to pathology COMPLICATIONS:  None immediate.  DESCRIPTION OF PROCEDURE:  The patient received prophylactic intravenous antibiotics and had sequential compression devices applied to her lower extremities while in the preoperative area.    She was taken to the operating room, where she was identified by name and birth date. General anesthesia was administered and was found to be adequate.  She was  placed in the dorsal lithotomy position, and was prepped and draped in a sterile manner.  A formal time out procedure was performed with all team members present and in agreement. A Foley catheter was inserted into her bladder and attached to gravity drainage. Attention was turned to her pelvis. Of note, all sutures used in this case were 0 Vicryl unless otherwise noted.     A weighted speculum was placed in the vagina, and the anterior and posterior lips of the cervix were grasped bilaterally with thyroid tenaculums.  The cervix was then injected circumferentially with 0.25% Marcaine with epinephrine solution to maintain hemostasis.  The cervix was circumferentially incised using electrocautery, and the posterior cul-de-sac was entered sharply in the midline.   A long weighted speculum was inserted into the posterior cul-de-sac. The bladder was dissected off the pubocervical fascia anteriorly with sharp and careful blunt dissection without complication.  The anterior cul-de-sac was then entered sharply without difficulty and the bladder retracted out of the operative field behind a retractor. The Heaney clamp was then used to clamp the uterosacral ligaments on either side.  They were then cut and sutured ligated with 0 Vicryl, and the ligated uterosacral ligaments were transfixed to the ipsilateral vaginal epithelium to further support the vagina and provide hemostasis. The cardinal ligaments were then clamped, cut and ligated. The uterine vessels and broad ligaments were then serially clamped with the Heaney clamps, cut, and suture ligated on both sides.  Excellent hemostasis was noted at this point.    The uterus was then delivered via the posterior cul-de-sac, and the cornua were clamped with the Heaney clamps, transected, and the uterine specimen was delivered and sent to pathology. These pedicles were then suture ligated to ensure hemostasis.   BILATERAL SALPINGECTOMY PARAGRAPH The Fallopian tubes  were  then identified and individually grasped with Babcock clamps. They were clamped across entirely with a Heaney clamp and free-tied, followed by suture ligation for excellent hemostasis bilaterally. The ovaries were left intact and in place.  After completion of the hysterectomy, all pedicles from the uterosacral ligament to the cornua were examined hemostasis was confirmed.  The peritoneum was closed in a purse string fashion with 2-0 PDS, taking care not to incorporate any intraabdominal organs in the closure.  Anterior repair  Attention was then turned to the anterior vaginal mucosa that was grasped in the midline using Allis clamps and injected with local anesthetic.  A midline mucosal incision was made from the bladder neck to the vaginal apex. The edges of this incision were grasped with a series of small Kelly clamps and the vaginal mucosa was dissected off the underlying pubocervical fascia bilaterally using blunt and sharp dissection.  The dissection was taken to the paravaginal spaces bilaterally.  The pubocervical fascia was then plicated in the midline using 2-0 Vicryl sutures from the bladder neck to the bladder base resulting in excellent surgical correction of the cystocele.  Excess vaginal tissue was then excised bilaterally and the incision was then closed with 2-0 Vicryl in a running suture.  Overall excellent hemostasis was noted.    The vaginal cuff was then closed with in a running locked fashion with care given to incorporate the uterosacral pedicles bilaterally, which were also tied in the midline.  All instruments were then removed from the pelvis.  Vaginal packing x1 was placed with Premarin cream. The Foley catheter was replaced for postoperative care.  The patient tolerated the procedure well.  All instruments, needles, and sponge counts were correct x 2. The patient was taken to the recovery room in stable condition.    Angelina Pih, MD, MPH

## 2016-04-20 NOTE — Anesthesia Preprocedure Evaluation (Signed)
Anesthesia Evaluation  Patient identified by MRN, date of birth, ID band Patient awake    Reviewed: Allergy & Precautions, NPO status , Patient's Chart, lab work & pertinent test results  History of Anesthesia Complications Negative for: history of anesthetic complications  Airway Mallampati: I  TM Distance: >3 FB Neck ROM: Full    Dental no notable dental hx.    Pulmonary neg pulmonary ROS, neg COPD,    Pulmonary exam normal - rhonchi (-) wheezing      Cardiovascular hypertension, Pt. on medications (-) angina(-) CAD and (-) Past MI  Rhythm:Regular - Systolic murmurs and - Diastolic murmurs    Neuro/Psych Anxiety negative neurological ROS     GI/Hepatic negative GI ROS, Neg liver ROS,   Endo/Other  negative endocrine ROSneg diabetes  Renal/GU negative Renal ROS     Musculoskeletal negative musculoskeletal ROS (+)   Abdominal (+) - obese,   Peds  Hematology negative hematology ROS (+)   Anesthesia Other Findings   Reproductive/Obstetrics                             Anesthesia Physical Anesthesia Plan  ASA: II  Anesthesia Plan: General   Post-op Pain Management:    Induction:   Airway Management Planned: LMA  Additional Equipment:   Intra-op Plan:   Post-operative Plan:   Informed Consent: I have reviewed the patients History and Physical, chart, labs and discussed the procedure including the risks, benefits and alternatives for the proposed anesthesia with the patient or authorized representative who has indicated his/her understanding and acceptance.   Dental advisory given  Plan Discussed with: Anesthesiologist and CRNA  Anesthesia Plan Comments:         Anesthesia Quick Evaluation

## 2016-04-21 DIAGNOSIS — D060 Carcinoma in situ of endocervix: Secondary | ICD-10-CM | POA: Diagnosis not present

## 2016-04-21 LAB — CBC
HCT: 37.5 % (ref 35.0–47.0)
Hemoglobin: 13 g/dL (ref 12.0–16.0)
MCH: 30.7 pg (ref 26.0–34.0)
MCHC: 34.5 g/dL (ref 32.0–36.0)
MCV: 88.8 fL (ref 80.0–100.0)
PLATELETS: 236 10*3/uL (ref 150–440)
RBC: 4.23 MIL/uL (ref 3.80–5.20)
RDW: 12.6 % (ref 11.5–14.5)
WBC: 15.9 10*3/uL — AB (ref 3.6–11.0)

## 2016-04-21 LAB — BASIC METABOLIC PANEL
ANION GAP: 7 (ref 5–15)
BUN: 13 mg/dL (ref 6–20)
CALCIUM: 8.6 mg/dL — AB (ref 8.9–10.3)
CO2: 27 mmol/L (ref 22–32)
Chloride: 103 mmol/L (ref 101–111)
Creatinine, Ser: 0.71 mg/dL (ref 0.44–1.00)
GFR calc Af Amer: >60 mL/min (ref >60)
Glucose, Bld: 117 mg/dL — ABNORMAL HIGH (ref 65–99)
POTASSIUM: 3.6 mmol/L (ref 3.5–5.1)
SODIUM: 137 mmol/L (ref 135–145)

## 2016-04-21 MED ORDER — IBUPROFEN 600 MG PO TABS: 600.0000 mg | ORAL_TABLET | Freq: Four times a day (QID) | ORAL | 0 refills | Status: DC | PRN

## 2016-04-21 MED ORDER — OXYCODONE-ACETAMINOPHEN 5-325 MG PO TABS: 1.0000 | ORAL_TABLET | Freq: Four times a day (QID) | ORAL | 0 refills | Status: DC | PRN

## 2016-04-21 MED ORDER — DOCUSATE SODIUM 100 MG PO CAPS: 100.0000 mg | ORAL_CAPSULE | Freq: Two times a day (BID) | ORAL | 0 refills | Status: AC

## 2016-04-21 NOTE — Discharge Summary (Signed)
Physician Discharge Summary  Patient ID: Grace Moreno MRN: [id number] DOB/AGE: Feb 01, 1971 45 y.o.  Admit date: 04/20/2016 Discharge date: 04/21/2016  Admission Diagnoses:  Discharge Diagnoses:  Active Problems:   CIN II (cervical intraepithelial neoplasia II)   Discharged Condition: good  Hospital Course:  1 Day Post-Op Subjective: The patient is doing well.  No nausea or vomiting. Pain is adequately controlled. Foley cath and vaginal packing removed without complication or difficulty. Pt c/o headache. With hypotension and hypertensive meds held.  Objective: Vital signs in last 24 hours: Temp:  [97.2 F (36.2 C)-99 F (37.2 C)] 98.4 F (36.9 C) (08/01 0819) Pulse Rate:  [57-78] 68 (08/01 0819) Resp:  [8-23] 18 (08/01 0819) BP: (100-118)/(54-77) 105/59 (08/01 0819) SpO2:  [93 %-98 %] 98 % (08/01 0819)  Intake/Output  Intake/Output Summary (Last 24 hours) at 04/21/16 0851 Last data filed at 04/21/16 0532  Gross per 24 hour  Intake             3319 ml  Output             2700 ml  Net              619 ml    Physical Exam:  General: Alert and oriented. Neuro: intact, CN grossly and bilateral LE CV: RRR Lungs: Clear bilaterally. GI: Soft, Nondistended. Incisions: Clean and dry. Urine: Clear, Foley in place Extremities: Nontender, no erythema, no edema. Left thigh pain that is related to hip troubles that date to before surgery.  Lab Results:  Recent Labs  04/21/16 0416  HGB 13.0  HCT 37.5  WBC 15.9*  PLT 236                 Results for orders placed or performed during the hospital encounter of 04/20/16 (from the past 24 hour(s))  CBC     Status: Abnormal   Collection Time: 04/21/16  4:16 AM  Result Value Ref Range   WBC 15.9 (H) 3.6 - 11.0 K/uL   RBC 4.23 3.80 - 5.20 MIL/uL   Hemoglobin 13.0 12.0 - 16.0 g/dL   HCT 37.5 35.0 - 47.0 %   MCV 88.8 80.0 - 100.0 fL   MCH 30.7 26.0 - 34.0 pg   MCHC 34.5 32.0 - 36.0 g/dL   RDW 12.6 11.5 - 14.5 %   Platelets 236 150 - 440 K/uL  Basic metabolic panel     Status: Abnormal   Collection Time: 04/21/16  4:16 AM  Result Value Ref Range   Sodium 137 135 - 145 mmol/L   Potassium 3.6 3.5 - 5.1 mmol/L   Chloride 103 101 - 111 mmol/L   CO2 27 22 - 32 mmol/L   Glucose, Bld 117 (H) 65 - 99 mg/dL   BUN 13 6 - 20 mg/dL   Creatinine, Ser 0.71 0.44 - 1.00 mg/dL   Calcium 8.6 (L) 8.9 - 10.3 mg/dL   GFR calc non Af Amer >60 >60 mL/min   GFR calc Af Amer >60 >60 mL/min   Anion gap 7 5 - 15    Assessment/Plan: POD# 1 s/p TVH, anterior repair.  1) Ambulate, Incentive spirometry 2) Advance diet as tolerated 3) Transition to oral pain medication 4) D/C Foley 5) Discharge home today anticipated    Benjaman Kindler, MD   LOS: 0 days   Benjaman Kindler 04/21/2016, 8:51 AM  Consults: None  Discharge Exam: Blood pressure (!) 105/59, pulse 68, temperature 98.4 F (36.9 C), temperature source Oral, resp. rate  18, weight 165 lb (74.8 kg), last menstrual period 04/14/2016, SpO2 98 %.  Disposition: Final discharge disposition not confirmed     Medication List    TAKE these medications   BIOTIN 5000 5 MG Caps Generic drug:  Biotin Take 10 mg by mouth daily.   docusate sodium 100 MG capsule Commonly known as:  COLACE Take 1 capsule (100 mg total) by mouth 2 (two) times daily.   ibuprofen 600 MG tablet Commonly known as:  ADVIL,MOTRIN Take 1 tablet (600 mg total) by mouth every 6 (six) hours as needed (mild pain).   losartan-hydrochlorothiazide 50-12.5 MG tablet Commonly known as:  HYZAAR Take 1 tablet by mouth daily.   multivitamin tablet Take 1 tablet by mouth daily.   oxyCODONE-acetaminophen 5-325 MG tablet Commonly known as:  PERCOCET/ROXICET Take 1-2 tablets by mouth every 6 (six) hours as needed.   sertraline 100 MG tablet Commonly known as:  ZOLOFT Take 100 mg by mouth daily.   VITAMIN B 12 PO Take 1 tablet by mouth daily.      Follow-up Information    Benjaman Kindler, MD In 2 weeks.   Specialty:  Obstetrics and Gynecology Why:  For postop check Contact information: [address] [place] [address] [phone]           Signed: Benjaman Kindler 04/21/2016, 8:51 AM

## 2016-04-21 NOTE — Progress Notes (Signed)
D/C order from Dr. Leafy Ro.  Reviewed d/c instructions and prescriptions with patient and answered any questions.  Patient d/c home via wheelchair by auxillary.

## 2016-04-21 NOTE — Discharge Instructions (Signed)
Signs and Symptoms to Report Call our office at [phone] if you have any of the following.   Fever over 100.4 degrees or higher  Severe stomach pain not relieved with pain medications  Bright red bleeding thats heavier than a period that does not slow with rest  To go the bathroom a lot (frequency), you cant hold your urine (urgency), or it hurts when you empty your bladder (urinate)  Chest pain  Shortness of breath  Pain in the calves of your legs  Severe nausea and vomiting not relieved with anti-nausea medications  Signs of infection around your wounds, such as redness, hot to touch, swelling, green/yellow drainage (like pus), bad smelling discharge  Any concerns  What You Can Expect after Surgery  You may see some pink tinged, bloody fluid and bruising around the wound. This is normal.  You may have a sore throat because of the tube in your mouth during general anesthesia. This will go away in 2 to 3 days.  You may have some stomach cramps.  You may notice spotting on your panties.  You may have pain around the incision sites.   Activities after Your Discharge Follow these guidelines to help speed your recovery at home:  Do the coughing and deep breathing as you did in the hospital for 2 weeks. Use the small blue breathing device, called the incentive spirometer for 2 weeks.  Dont drive if you are in pain or taking narcotic pain medicine. You may drive when you can safely slam on the brakes, turn the wheel forcefully, and rotate your torso comfortably. This is typically 1-2 weeks. Practice in a parking lot or side street prior to attempting to drive regularly.   Ask others to help with household chores for 4 weeks.  Do not lift anything heavier that 10 pounds for 4-6 weeks. This includes pets, children, and groceries.  Dont do strenuous activities, exercises, or sports like vacuuming, tennis, squash, etc. until your doctor says it is safe to do so. ---If  you had a hysterectomy (abdominal, laparoscopic, or vaginal) do not have intercourse for 8-10 weeks.   Walk as you feel able. Rest often since it may take two or three weeks for your energy level to return to normal.   You may climb stairs  Avoid constipation:   -Eat fruits, vegetables, and whole grains. Eat small meals as your appetite will take time to return to normal.   -Drink 6 to 8 glasses of water each day unless your doctor has told you to limit your fluids.   -Use a laxative or stool softener as needed if constipation becomes a problem. You may take Miralax, metamucil, Citrucil, Colace, Senekot, FiberCon, etc. If this does not relieve the constipation, try two tablespoons of Milk Of Magnesia every 8 hours until your bowels move.   You may shower. Gently wash the wounds with a mild soap and water. Pat dry.  Do not get in a hot tub, swimming pool, etc. until your doctor agrees.  Do not use lotions, oils, powders on the wounds.  Do not douche, use tampons, or have sex until your doctor says it is okay.  Take your pain medicine when you need it. The medicine may not work as well if the pain is bad.  Take the medicines you were taking before surgery. Other medications you will need are pain medications (Norco or Percocet) and nausea medications (Zofran).     Websites about bowel health: [employer] (I know, I  know, but there is a great blog on constipation, which I am summarizing below. You will note that the two things I mentionged, Colace and Miralax, she does not recommend- however, I have found that most people do well on them. Try any ones you like and let me know!)  Here is a helpful article from the website [employer], regarding constipation  Here are reasons why constipation occurs after surgery: 1) During the operation and in the recovery room, most people are given opioid pain medication, primarily through an IV, to treat moderate or severe pain. Intravenous opioids include  morphine, Dilaudid and fentanyl. After surgery, patients are often prescribed opioid pain medication to take by mouth at home, including codeine, Vicodin, Norco, and Percocet. All of these medications cause constipation by slowing down the movement of your intestine. 2) Changes in your diet before surgery can be another culprit. It is common to get specific instructions to change how you normally eat or drink before your surgery, like only having liquids the day before or not having anything to eat or drink after midnight the night before surgery. For this reason, temporary dehydration may occur. This, along with not eating or only having liquids, means that you are getting less fiber than usual. Both these factors contribute to constipation. 3) Changes in your diet after surgery can also contribute to the problem. Although many people dont have dietary restrictions after operations, being under anesthesia can make you lose your appetite for several hours and maybe even days. Some people can even have nausea or vomiting. Not eating or drinking normally means that you are not getting enough fiber and you can get dehydrated, both leading to constipation. 4) Lying in a bed more than usual--which happens before, during and after surgery--combined with the medications and diet changes, all work together to slow down your colon and make your poop turn to rock.  No one likes to be constipated.  Lets face it, its not a pleasant feeling when you dont poop for days, then strain on the toilet to finally pass something large enough to cause damage. An ounce of prevention is worth a pound of cure, so: 1. Assume you will be constipated. 2. Plan and prepare accordingly. Post-surgery is one of those unique situations where the temporary use of laxatives can make a world of difference. Always consult with your doctor, and recognize that if you wait several days after surgery to take a laxative, the constipation might  be too severe for these over-the-counter options. It is always important to discuss all medications you plan on taking with your doctor. Ask your doctor if you can start the laxative immediately after surgery. *  Here are go-to post-surgery laxatives: Senna: Senna is an herb that acts as a stimulant laxative, meaning it increases the activity of the intestine to cause you to have a bowel movement. It comes in many forms, but senna pills are easy to take and are sold over the counter at almost all pharmacies. Since opioid pain medications slow down the activity of the intestine, it makes sense to take a medication to help reverse that side effect. Long-term use of a stimulant laxative is not a good idea since it can make your colon lazy and not function properly; however, temporary use immediately after surgery is acceptable. In general, if you are able to eat a normal diet, taking senna soon after surgery works the best. Senna usually works within hours to produce a bowel movement, but this is  less predictable when you are taking different medications after surgery. Try not to wait several days to start taking senna, as often it is too late by then. Just like with all medications or supplements, check with your doctor before starting new treatment.   Magnesium: Magnesium is an important mineral that our body needs. We get magnesium from some foods that we eat, especially foods that are high in fiber such as broccoli, almonds and whole grains. There are also magnesium-based medications used to treat constipation including milk of magnesia (magnesium hydroxide), magnesium citrate and magnesium oxide. They work by drawing water into the intestine, putting it into the class of osmotic laxatives. Magnesium products in low doses appear to be safe, but if taken in very large doses, can lead to problems such as irregular heartbeat, low blood pressure and even death. It can also affect other medications you might  be taking, therefore it is important to discuss using magnesium with your physician and pharmacist before initiating therapy. Most over-the-counter magnesium laxatives work very well to help with the constipation related to surgery, but sometimes they work too well and lead to diarrhea. Make sure you are somewhere with easy access to a bathroom, just in case.   Bisacodyl: Bisacodyl (generic name) is sold under brand names such as Dulcolax. Much like senna, it is a stimulant laxative, meaning it makes your intestines move more quickly to push out the stool. This is another good choice to start taking as soon as your doctor says you can take a laxative after surgery. It comes in pill form and as a suppository, which is a good choice for people who cannot or are not allowed to swallow pills. Studies have shown that it works as a laxative, but like most of these medications, you should use this on a short-term basis only.   Enema: Enemas strike fear in many people, but FEAR NOT! Its nowhere near as big a deal as you may think. An enema is just a way to get some liquid into your rectum by placing a specially designed device through your anus. If you have never done one, it might seem like a painful, unpleasant, uncomfortable, complicated and lengthy procedure. But in reality, its simple, takes just a few seconds and is highly effective. The small ready-made bottles you buy at the pharmacy are much easier than the hose/large rubber container type. Those recommended positions illustrated in some instructions are generally not necessary to place the enema. Its very similar to the insertion of a tampon, requiring a slight squat. Some extra lubrication on the enemas tip (or on your anus) will make it a breeze. In certain cases, there is no substitute for a good enema. For example, if someone has not pooped for a few days, the beginning of the poop waiting to come out can become rock hard. Passing that hard stool  can lead to much pain and problems like anal fissures. Inserting a little liquid to break up the rock-hard stool will help make its passage much easier. Enemas come with different liquids. Most come with saline, but there are also mineral oil options. You can also use warm water in the reusable enema containers. They all work. But since saline can sometimes be irritating, so try a mineral oil or water enema instead.  Here are commonly recommended constipation medications that do not work well for post-surgery constipation: Docusate: Docusate (generic name) most commonly referred to as Colace (brand name) is not really a laxative, but  is classified as a stool softener. Although this medication is commonly prescribed, it is not recommended for several reasons: 1) there is no good medical evidence that it works 2) even if it has an effect, which is very questionable, it is minimal and cannot combat the intestinal slowing caused by the opioid medications. Skip docusate to save money and space in your pillbox for something more effective.  PEG: Miralax (brand name) is basically a chemical called polyethylene glycol (PEG) and it has gained tremendous popularity as a laxative. This product is an osmotic laxative meaning it works by pulling water into the stool, making it softer. This is very similar to the action of natural fiber in foods and supplements. Therefore, the effect seen by this medication is not immediate, causing a bowel movement in a day or more. Is this medication strong enough to battle the constipation related to having an operation? Maybe for some people not prone to constipation. But for most people, other laxatives are better to prevent constipation after surgery.

## 2016-04-22 LAB — SURGICAL PATHOLOGY

## 2016-05-13 ENCOUNTER — Ambulatory Visit
Admission: RE | Admit: 2016-05-13 | Discharge: 2016-05-13 | Disposition: A | Discharge status: Home / Self Care | Payer: 59 | Source: Ambulatory Visit | Attending: Obstetrics and Gynecology | Admitting: Obstetrics and Gynecology

## 2016-05-13 ENCOUNTER — Encounter: Payer: Self-pay | Admitting: Radiology

## 2016-05-13 DIAGNOSIS — N63 Unspecified lump in unspecified breast: Secondary | ICD-10-CM

## 2016-05-13 DIAGNOSIS — N6001 Solitary cyst of right breast: Secondary | ICD-10-CM | POA: Insufficient documentation

## 2016-05-13 DIAGNOSIS — N6002 Solitary cyst of left breast: Secondary | ICD-10-CM | POA: Insufficient documentation

## 2016-05-22 ENCOUNTER — Other Ambulatory Visit: Payer: Self-pay | Admitting: Obstetrics and Gynecology

## 2016-05-22 DIAGNOSIS — G8918 Other acute postprocedural pain: Secondary | ICD-10-CM

## 2016-06-03 ENCOUNTER — Ambulatory Visit
Admission: RE | Admit: 2016-06-03 | Discharge: 2016-06-03 | Disposition: A | Discharge status: Home / Self Care | Payer: 59 | Source: Ambulatory Visit | Attending: Obstetrics and Gynecology | Admitting: Obstetrics and Gynecology

## 2016-06-03 DIAGNOSIS — Z90710 Acquired absence of both cervix and uterus: Secondary | ICD-10-CM | POA: Insufficient documentation

## 2016-06-03 DIAGNOSIS — N83201 Unspecified ovarian cyst, right side: Secondary | ICD-10-CM | POA: Insufficient documentation

## 2016-06-03 DIAGNOSIS — G8918 Other acute postprocedural pain: Secondary | ICD-10-CM | POA: Insufficient documentation

## 2016-06-03 HISTORY — DX: Malignant (primary) neoplasm, unspecified: C80.1

## 2016-06-03 MED ORDER — IOPAMIDOL (ISOVUE-300) INJECTION 61%
100.0000 mL | Freq: Once | INTRAVENOUS | Status: AC | PRN
  Administered 2016-06-03: 100 mL via INTRAVENOUS

## 2017-04-27 ENCOUNTER — Other Ambulatory Visit: Payer: Self-pay | Admitting: Obstetrics and Gynecology

## 2017-04-27 DIAGNOSIS — Z1231 Encounter for screening mammogram for malignant neoplasm of breast: Secondary | ICD-10-CM

## 2017-05-18 ENCOUNTER — Ambulatory Visit
Admission: RE | Admit: 2017-05-18 | Discharge: 2017-05-18 | Disposition: A | Discharge status: Home / Self Care | Payer: 59 | Source: Ambulatory Visit | Attending: Obstetrics and Gynecology | Admitting: Obstetrics and Gynecology

## 2017-05-18 DIAGNOSIS — Z1231 Encounter for screening mammogram for malignant neoplasm of breast: Secondary | ICD-10-CM | POA: Diagnosis present

## 2017-08-04 ENCOUNTER — Other Ambulatory Visit: Payer: Self-pay | Admitting: Obstetrics and Gynecology

## 2017-08-04 DIAGNOSIS — N6315 Unspecified lump in the right breast, overlapping quadrants: Secondary | ICD-10-CM

## 2017-08-04 DIAGNOSIS — N6310 Unspecified lump in the right breast, unspecified quadrant: Principal | ICD-10-CM

## 2017-08-16 ENCOUNTER — Ambulatory Visit
Admission: RE | Admit: 2017-08-16 | Discharge: 2017-08-16 | Disposition: A | Discharge status: Home / Self Care | Payer: 59 | Source: Ambulatory Visit | Attending: Obstetrics and Gynecology | Admitting: Obstetrics and Gynecology

## 2017-08-16 DIAGNOSIS — N6313 Unspecified lump in the right breast, lower outer quadrant: Secondary | ICD-10-CM | POA: Insufficient documentation

## 2017-08-16 DIAGNOSIS — N6310 Unspecified lump in the right breast, unspecified quadrant: Secondary | ICD-10-CM

## 2017-08-16 DIAGNOSIS — N6001 Solitary cyst of right breast: Secondary | ICD-10-CM | POA: Diagnosis not present

## 2017-08-16 DIAGNOSIS — N6311 Unspecified lump in the right breast, upper outer quadrant: Secondary | ICD-10-CM | POA: Insufficient documentation

## 2017-08-16 DIAGNOSIS — N6315 Unspecified lump in the right breast, overlapping quadrants: Secondary | ICD-10-CM

## 2018-05-03 ENCOUNTER — Other Ambulatory Visit: Payer: Self-pay | Admitting: Obstetrics and Gynecology

## 2018-05-03 DIAGNOSIS — Z1231 Encounter for screening mammogram for malignant neoplasm of breast: Secondary | ICD-10-CM

## 2018-05-25 ENCOUNTER — Ambulatory Visit
Admission: RE | Admit: 2018-05-25 | Discharge: 2018-05-25 | Disposition: A | Discharge status: Home / Self Care | Payer: Managed Care, Other (non HMO) | Source: Ambulatory Visit | Attending: Obstetrics and Gynecology | Admitting: Obstetrics and Gynecology

## 2018-05-25 DIAGNOSIS — Z1231 Encounter for screening mammogram for malignant neoplasm of breast: Secondary | ICD-10-CM

## 2018-05-30 ENCOUNTER — Other Ambulatory Visit: Payer: Self-pay | Admitting: Obstetrics and Gynecology

## 2018-05-30 DIAGNOSIS — R928 Other abnormal and inconclusive findings on diagnostic imaging of breast: Secondary | ICD-10-CM

## 2018-05-30 DIAGNOSIS — N6320 Unspecified lump in the left breast, unspecified quadrant: Secondary | ICD-10-CM

## 2018-06-02 ENCOUNTER — Ambulatory Visit
Admission: RE | Admit: 2018-06-02 | Discharge: 2018-06-02 | Disposition: A | Discharge status: Home / Self Care | Payer: Managed Care, Other (non HMO) | Source: Ambulatory Visit | Attending: Obstetrics and Gynecology | Admitting: Obstetrics and Gynecology

## 2018-06-02 DIAGNOSIS — R928 Other abnormal and inconclusive findings on diagnostic imaging of breast: Secondary | ICD-10-CM | POA: Insufficient documentation

## 2018-06-02 DIAGNOSIS — N6320 Unspecified lump in the left breast, unspecified quadrant: Secondary | ICD-10-CM | POA: Diagnosis present

## 2018-07-29 ENCOUNTER — Other Ambulatory Visit: Payer: Self-pay | Admitting: Internal Medicine

## 2018-07-29 DIAGNOSIS — N871 Moderate cervical dysplasia: Secondary | ICD-10-CM

## 2018-08-22 ENCOUNTER — Ambulatory Visit: Payer: Managed Care, Other (non HMO)

## 2018-10-03 ENCOUNTER — Other Ambulatory Visit: Payer: Self-pay | Admitting: Internal Medicine

## 2018-10-03 DIAGNOSIS — M545 Low back pain, unspecified: Secondary | ICD-10-CM

## 2018-10-03 DIAGNOSIS — N871 Moderate cervical dysplasia: Secondary | ICD-10-CM

## 2018-10-05 ENCOUNTER — Ambulatory Visit: Payer: Managed Care, Other (non HMO)

## 2018-10-08 ENCOUNTER — Ambulatory Visit
Admission: RE | Admit: 2018-10-08 | Discharge: 2018-10-08 | Disposition: A | Discharge status: Home / Self Care | Payer: Managed Care, Other (non HMO) | Source: Ambulatory Visit | Attending: Internal Medicine | Admitting: Internal Medicine

## 2018-10-08 DIAGNOSIS — M545 Low back pain, unspecified: Secondary | ICD-10-CM

## 2018-10-08 DIAGNOSIS — N871 Moderate cervical dysplasia: Secondary | ICD-10-CM

## 2019-01-30 ENCOUNTER — Other Ambulatory Visit: Payer: Self-pay | Admitting: Internal Medicine

## 2019-01-30 DIAGNOSIS — R310 Gross hematuria: Secondary | ICD-10-CM

## 2019-02-03 ENCOUNTER — Other Ambulatory Visit: Payer: Self-pay

## 2019-02-03 ENCOUNTER — Ambulatory Visit
Admission: RE | Admit: 2019-02-03 | Discharge: 2019-02-03 | Disposition: A | Discharge status: Home / Self Care | Payer: Managed Care, Other (non HMO) | Source: Ambulatory Visit | Attending: Internal Medicine | Admitting: Internal Medicine

## 2019-02-03 DIAGNOSIS — R310 Gross hematuria: Secondary | ICD-10-CM | POA: Insufficient documentation

## 2019-02-03 MED ORDER — IOHEXOL 300 MG/ML  SOLN
125.0000 mL | Freq: Once | INTRAMUSCULAR | Status: AC | PRN
  Administered 2019-02-03: 125 mL via INTRAVENOUS

## 2019-05-09 ENCOUNTER — Other Ambulatory Visit: Payer: Self-pay | Admitting: Obstetrics and Gynecology

## 2019-06-08 ENCOUNTER — Other Ambulatory Visit: Payer: Self-pay | Admitting: Obstetrics and Gynecology

## 2019-06-08 DIAGNOSIS — Z1231 Encounter for screening mammogram for malignant neoplasm of breast: Secondary | ICD-10-CM

## 2019-07-05 ENCOUNTER — Other Ambulatory Visit: Payer: Self-pay | Admitting: Orthopedic Surgery

## 2019-07-12 ENCOUNTER — Other Ambulatory Visit: Payer: Self-pay | Admitting: Orthopedic Surgery

## 2019-07-12 ENCOUNTER — Other Ambulatory Visit: Payer: Self-pay

## 2019-07-12 ENCOUNTER — Encounter
Admission: RE | Admit: 2019-07-12 | Discharge: 2019-07-12 | Disposition: A | Discharge status: Home / Self Care | Payer: Managed Care, Other (non HMO) | Source: Ambulatory Visit | Attending: Orthopedic Surgery | Admitting: Orthopedic Surgery

## 2019-07-12 DIAGNOSIS — Z01812 Encounter for preprocedural laboratory examination: Secondary | ICD-10-CM | POA: Diagnosis not present

## 2019-07-12 LAB — BASIC METABOLIC PANEL
Anion gap: 8 (ref 5–15)
BUN: 17 mg/dL (ref 6–20)
CO2: 26 mmol/L (ref 22–32)
Calcium: 9.4 mg/dL (ref 8.9–10.3)
Chloride: 105 mmol/L (ref 98–111)
Creatinine, Ser: 0.75 mg/dL (ref 0.44–1.00)
GFR calc Af Amer: >60 mL/min (ref >60)
GFR calc non Af Amer: >60 mL/min (ref >60)
Glucose, Bld: 93 mg/dL (ref 70–99)
Potassium: 4.2 mmol/L (ref 3.5–5.1)
Sodium: 139 mmol/L (ref 135–145)

## 2019-07-12 LAB — PROTIME-INR
INR: 1 (ref 0.8–1.2)
Prothrombin Time: 12.7 seconds (ref 11.4–15.2)

## 2019-07-12 LAB — URINALYSIS, ROUTINE W REFLEX MICROSCOPIC
Bilirubin Urine: NEGATIVE
Glucose, UA: NEGATIVE mg/dL
Hgb urine dipstick: NEGATIVE
Ketones, ur: NEGATIVE mg/dL
Leukocytes,Ua: NEGATIVE
Nitrite: NEGATIVE
Protein, ur: NEGATIVE mg/dL
Specific Gravity, Urine: 1.017 (ref 1.005–1.030)
pH: 7 (ref 5.0–8.0)

## 2019-07-12 LAB — SURGICAL PCR SCREEN
MRSA, PCR: NEGATIVE
Staphylococcus aureus: NEGATIVE

## 2019-07-12 LAB — CBC
HCT: 40.8 % (ref 36.0–46.0)
Hemoglobin: 13.4 g/dL (ref 12.0–15.0)
MCH: 29.3 pg (ref 26.0–34.0)
MCHC: 32.8 g/dL (ref 30.0–36.0)
MCV: 89.1 fL (ref 80.0–100.0)
Platelets: 250 10*3/uL (ref 150–400)
RBC: 4.58 MIL/uL (ref 3.87–5.11)
RDW: 11.7 % (ref 11.5–15.5)
WBC: 7.8 10*3/uL (ref 4.0–10.5)
nRBC: 0 % (ref 0.0–0.2)

## 2019-07-12 LAB — TYPE AND SCREEN
ABO/RH(D): O POS
Antibody Screen: NEGATIVE

## 2019-07-12 LAB — APTT: aPTT: 32 seconds (ref 24–36)

## 2019-07-12 LAB — SEDIMENTATION RATE: Sed Rate: 6 mm/hr (ref 0–20)

## 2019-07-12 NOTE — Patient Instructions (Signed)
Your procedure is scheduled on: July 25, 2019 Tuesday  Report to Day Surgery on the 2nd floor of the [facility]. To find out your arrival time, please call [phone] between 1PM - 3PM on: July 24, 2019 Monday  REMEMBER: Instructions that are not followed completely may result in serious medical risk, up to and including death; or upon the discretion of your surgeon and anesthesiologist your surgery may need to be rescheduled.  Do not eat food after midnight the night before surgery.  No gum chewing, lozengers or hard candies.  You may however, drink CLEAR liquids up to 2 hours before you are scheduled to arrive for your surgery. Do not drink anything within 2 hours of the start of your surgery.  Clear liquids include: - water  - apple juice without pulp -clear  gatorade - black coffee or tea (Do NOT add milk or creamers to the coffee or tea) Do NOT drink anything that is not on this list.  Type 1 and Type 2 diabetics should only drink water.  No Alcohol for 24 hours before or after surgery.  No Smoking including e-cigarettes for 24 hours prior to surgery.  No chewable tobacco products for at least 6 hours prior to surgery.  No nicotine patches on the day of surgery.  On the morning of surgery brush your teeth with toothpaste and water, you may rinse your mouth with mouthwash if you wish. Do not swallow any toothpaste or mouthwash.  Notify your doctor if there is any change in your medical condition (cold, fever, infection).  Do not wear jewelry, make-up, hairpins, clips or nail polish.  Do not wear lotions, powders, or perfumes.   Do not shave 48 hours prior to surgery.   Contacts and dentures may not be worn into surgery.  Do not bring valuables to the hospital, including drivers license, insurance or credit cards.  [facility] is not responsible for any belongings or valuables.   TAKE THESE MEDICATIONS THE MORNING OF SURGERY: Xanax   Use CHG Soap  as  directed on instruction sheet.  Follow recommendations from Cardiologist, Pulmonologist or PCP regarding stopping Aspirin, Coumadin, Plavix, Eliquis, Pradaxa, or Pletal.  Stop Anti-inflammatories (NSAIDS) such as Advil, Aleve, Ibuprofen, Motrin, Naproxen, Naprosyn and Aspirin based products such as Excedrin, Goodys Powder, BC Powder.  For 7-10 days before surgery (May take Tylenol or Acetaminophen if needed.)  Stop ANY OVER THE COUNTER supplements until after surgery. (May continue Vitamin D, Vitamin B, and multivitamin.)  Wear comfortable clothing (specific to your surgery type) to the hospital.  Plan for stool softeners for home use.  If you are being admitted to the hospital overnight, bring small bag if needed. After surgery it may be brought to your room.  If you are being discharged the day of surgery, you will not be allowed to drive home. You will need a responsible adult to drive you home and stay with you that night.   If you are taking public transportation, you will need to have a responsible adult with you. Please confirm with your physician that it is acceptable to use public transportation.   Please call [phone] if you have any questions about these instructions.

## 2019-07-13 ENCOUNTER — Ambulatory Visit
Admission: RE | Admit: 2019-07-13 | Discharge: 2019-07-13 | Disposition: A | Discharge status: Home / Self Care | Payer: Managed Care, Other (non HMO) | Source: Ambulatory Visit | Attending: Obstetrics and Gynecology | Admitting: Obstetrics and Gynecology

## 2019-07-13 ENCOUNTER — Other Ambulatory Visit: Payer: Self-pay | Admitting: Obstetrics and Gynecology

## 2019-07-13 ENCOUNTER — Encounter
Admission: RE | Admit: 2019-07-13 | Discharge: 2019-07-13 | Disposition: A | Discharge status: Home / Self Care | Payer: Managed Care, Other (non HMO) | Source: Ambulatory Visit | Attending: Orthopedic Surgery | Admitting: Orthopedic Surgery

## 2019-07-13 DIAGNOSIS — N6310 Unspecified lump in the right breast, unspecified quadrant: Secondary | ICD-10-CM

## 2019-07-13 DIAGNOSIS — Z1231 Encounter for screening mammogram for malignant neoplasm of breast: Secondary | ICD-10-CM | POA: Insufficient documentation

## 2019-07-13 DIAGNOSIS — N6320 Unspecified lump in the left breast, unspecified quadrant: Secondary | ICD-10-CM | POA: Insufficient documentation

## 2019-07-13 LAB — URINE CULTURE: Culture: NO GROWTH

## 2019-07-21 ENCOUNTER — Other Ambulatory Visit
Admission: RE | Admit: 2019-07-21 | Discharge: 2019-07-21 | Disposition: A | Discharge status: Home / Self Care | Payer: Managed Care, Other (non HMO) | Source: Ambulatory Visit | Attending: Orthopedic Surgery | Admitting: Orthopedic Surgery

## 2019-07-21 ENCOUNTER — Other Ambulatory Visit: Payer: Self-pay

## 2019-07-21 DIAGNOSIS — Z20828 Contact with and (suspected) exposure to other viral communicable diseases: Secondary | ICD-10-CM | POA: Insufficient documentation

## 2019-07-21 DIAGNOSIS — Z01812 Encounter for preprocedural laboratory examination: Secondary | ICD-10-CM | POA: Diagnosis not present

## 2019-07-21 LAB — SARS CORONAVIRUS 2 (TAT 6-24 HRS): SARS Coronavirus 2: NEGATIVE

## 2019-07-25 ENCOUNTER — Inpatient Hospital Stay: Payer: Managed Care, Other (non HMO)

## 2019-07-25 ENCOUNTER — Inpatient Hospital Stay
Admission: RE | Admit: 2019-07-25 | Discharge: 2019-07-28 | DRG: 470 | Disposition: A | Discharge status: Home Health | Payer: Managed Care, Other (non HMO) | Attending: Orthopedic Surgery | Admitting: Orthopedic Surgery

## 2019-07-25 ENCOUNTER — Encounter: Payer: Self-pay | Admitting: *Deleted

## 2019-07-25 ENCOUNTER — Other Ambulatory Visit: Payer: Self-pay

## 2019-07-25 ENCOUNTER — Inpatient Hospital Stay: Payer: Managed Care, Other (non HMO) | Admitting: Anesthesiology

## 2019-07-25 ENCOUNTER — Encounter
Admission: RE | Disposition: A | Discharge status: Home Health | Payer: Self-pay | Source: Home / Self Care | Attending: Orthopedic Surgery

## 2019-07-25 DIAGNOSIS — Z79899 Other long term (current) drug therapy: Secondary | ICD-10-CM | POA: Diagnosis not present

## 2019-07-25 DIAGNOSIS — Z90711 Acquired absence of uterus with remaining cervical stump: Secondary | ICD-10-CM

## 2019-07-25 DIAGNOSIS — Z96642 Presence of left artificial hip joint: Secondary | ICD-10-CM

## 2019-07-25 DIAGNOSIS — Z791 Long term (current) use of non-steroidal anti-inflammatories (NSAID): Secondary | ICD-10-CM

## 2019-07-25 DIAGNOSIS — I951 Orthostatic hypotension: Secondary | ICD-10-CM | POA: Diagnosis not present

## 2019-07-25 DIAGNOSIS — I10 Essential (primary) hypertension: Secondary | ICD-10-CM | POA: Diagnosis present

## 2019-07-25 DIAGNOSIS — F419 Anxiety disorder, unspecified: Secondary | ICD-10-CM | POA: Diagnosis present

## 2019-07-25 DIAGNOSIS — Z8249 Family history of ischemic heart disease and other diseases of the circulatory system: Secondary | ICD-10-CM

## 2019-07-25 DIAGNOSIS — G8918 Other acute postprocedural pain: Secondary | ICD-10-CM

## 2019-07-25 DIAGNOSIS — Z8541 Personal history of malignant neoplasm of cervix uteri: Secondary | ICD-10-CM | POA: Diagnosis not present

## 2019-07-25 DIAGNOSIS — Z888 Allergy status to other drugs, medicaments and biological substances status: Secondary | ICD-10-CM | POA: Diagnosis not present

## 2019-07-25 DIAGNOSIS — M7062 Trochanteric bursitis, left hip: Secondary | ICD-10-CM | POA: Diagnosis present

## 2019-07-25 DIAGNOSIS — R5082 Postprocedural fever: Secondary | ICD-10-CM | POA: Diagnosis not present

## 2019-07-25 DIAGNOSIS — M1612 Unilateral primary osteoarthritis, left hip: Principal | ICD-10-CM | POA: Diagnosis present

## 2019-07-25 DIAGNOSIS — Z419 Encounter for procedure for purposes other than remedying health state, unspecified: Secondary | ICD-10-CM

## 2019-07-25 HISTORY — PX: TOTAL HIP ARTHROPLASTY: SHX124

## 2019-07-25 HISTORY — PX: EXCISION/RELEASE BURSA HIP: SHX5014

## 2019-07-25 LAB — URINE DRUG SCREEN, QUALITATIVE (ARMC ONLY)
Amphetamines, Ur Screen: NOT DETECTED
Barbiturates, Ur Screen: NOT DETECTED
Benzodiazepine, Ur Scrn: NOT DETECTED
Cannabinoid 50 Ng, Ur ~~LOC~~: NOT DETECTED
Cocaine Metabolite,Ur ~~LOC~~: NOT DETECTED
MDMA (Ecstasy)Ur Screen: NOT DETECTED
Methadone Scn, Ur: NOT DETECTED
Opiate, Ur Screen: NOT DETECTED
Phencyclidine (PCP) Ur S: NOT DETECTED
Tricyclic, Ur Screen: NOT DETECTED

## 2019-07-25 LAB — CBC
HCT: 38.5 % (ref 36.0–46.0)
Hemoglobin: 12.6 g/dL (ref 12.0–15.0)
MCH: 29.4 pg (ref 26.0–34.0)
MCHC: 32.7 g/dL (ref 30.0–36.0)
MCV: 90 fL (ref 80.0–100.0)
Platelets: 218 10*3/uL (ref 150–400)
RBC: 4.28 MIL/uL (ref 3.87–5.11)
RDW: 11.5 % (ref 11.5–15.5)
WBC: 7.4 10*3/uL (ref 4.0–10.5)
nRBC: 0 % (ref 0.0–0.2)

## 2019-07-25 LAB — CREATININE, SERUM
Creatinine, Ser: 0.75 mg/dL (ref 0.44–1.00)
GFR calc Af Amer: >60 mL/min (ref >60)
GFR calc non Af Amer: >60 mL/min (ref >60)

## 2019-07-25 SURGERY — ARTHROPLASTY, HIP, TOTAL, ANTERIOR APPROACH
Anesthesia: Spinal | Site: Hip | Laterality: Left

## 2019-07-25 MED ORDER — ACETAMINOPHEN 500 MG PO TABS
1000.0000 mg | ORAL_TABLET | Freq: Four times a day (QID) | ORAL | Status: AC
  Administered 2019-07-25 – 2019-07-26 (×4): 1000 mg via ORAL
  Filled 2019-07-25 (×4): qty 2

## 2019-07-25 MED ORDER — ZOLPIDEM TARTRATE 5 MG PO TABS: 5.0000 mg | ORAL_TABLET | Freq: Every evening | ORAL | Status: DC | PRN

## 2019-07-25 MED ORDER — PANTOPRAZOLE SODIUM 40 MG PO TBEC
40.0000 mg | DELAYED_RELEASE_TABLET | Freq: Every day | ORAL | Status: DC
  Administered 2019-07-25 – 2019-07-28 (×4): 40 mg via ORAL
  Filled 2019-07-25 (×5): qty 1

## 2019-07-25 MED ORDER — METHOCARBAMOL 1000 MG/10ML IJ SOLN
500.0000 mg | Freq: Four times a day (QID) | INTRAVENOUS | Status: DC | PRN
  Filled 2019-07-25: qty 5

## 2019-07-25 MED ORDER — SODIUM CHLORIDE 0.9 % IV SOLN
INTRAVENOUS | Status: DC
  Administered 2019-07-25: 15:00:00 via INTRAVENOUS

## 2019-07-25 MED ORDER — CEFAZOLIN SODIUM-DEXTROSE 2-4 GM/100ML-% IV SOLN
2.0000 g | Freq: Four times a day (QID) | INTRAVENOUS | Status: AC
  Administered 2019-07-25 – 2019-07-26 (×3): 2 g via INTRAVENOUS
  Filled 2019-07-25 (×3): qty 100

## 2019-07-25 MED ORDER — CEFAZOLIN SODIUM-DEXTROSE 2-4 GM/100ML-% IV SOLN
INTRAVENOUS | Status: AC
  Filled 2019-07-25: qty 100

## 2019-07-25 MED ORDER — ENOXAPARIN SODIUM 40 MG/0.4ML ~~LOC~~ SOLN
40.0000 mg | SUBCUTANEOUS | Status: DC
  Administered 2019-07-26 – 2019-07-28 (×3): 40 mg via SUBCUTANEOUS
  Filled 2019-07-25 (×3): qty 0.4

## 2019-07-25 MED ORDER — CHLORHEXIDINE GLUCONATE 4 % EX LIQD: 60.0000 mL | Freq: Once | CUTANEOUS | Status: DC

## 2019-07-25 MED ORDER — SODIUM CHLORIDE 0.9 % IV SOLN
INTRAVENOUS | Status: DC | PRN
  Administered 2019-07-25: 11:00:00 25 ug/min via INTRAVENOUS

## 2019-07-25 MED ORDER — OXYCODONE HCL 5 MG PO TABS
5.0000 mg | ORAL_TABLET | ORAL | Status: DC | PRN
  Administered 2019-07-25 (×2): 10 mg via ORAL
  Administered 2019-07-28: 07:00:00 5 mg via ORAL
  Filled 2019-07-25: qty 2
  Filled 2019-07-25: qty 1
  Filled 2019-07-25: qty 2

## 2019-07-25 MED ORDER — PHENOL 1.4 % MT LIQD: 1.0000 | OROMUCOSAL | Status: DC | PRN

## 2019-07-25 MED ORDER — BUPIVACAINE-EPINEPHRINE 0.25% -1:200000 IJ SOLN
INTRAMUSCULAR | Status: DC | PRN
  Administered 2019-07-25: 30 mL

## 2019-07-25 MED ORDER — DIPHENHYDRAMINE HCL 12.5 MG/5ML PO ELIX: 12.5000 mg | ORAL_SOLUTION | ORAL | Status: DC | PRN

## 2019-07-25 MED ORDER — ONDANSETRON HCL 4 MG PO TABS: 4.0000 mg | ORAL_TABLET | Freq: Four times a day (QID) | ORAL | Status: DC | PRN

## 2019-07-25 MED ORDER — OXYCODONE HCL 5 MG PO TABS
10.0000 mg | ORAL_TABLET | ORAL | Status: DC | PRN
  Administered 2019-07-26: 10 mg via ORAL
  Filled 2019-07-25: qty 2

## 2019-07-25 MED ORDER — IRBESARTAN 75 MG PO TABS
75.0000 mg | ORAL_TABLET | Freq: Every day | ORAL | Status: DC
  Filled 2019-07-25 (×4): qty 1

## 2019-07-25 MED ORDER — PROPOFOL 10 MG/ML IV BOLUS
INTRAVENOUS | Status: DC | PRN
  Administered 2019-07-25: 50 mg via INTRAVENOUS

## 2019-07-25 MED ORDER — MENTHOL 3 MG MT LOZG: 1.0000 | LOZENGE | OROMUCOSAL | Status: DC | PRN

## 2019-07-25 MED ORDER — BUPIVACAINE HCL (PF) 0.5 % IJ SOLN
INTRAMUSCULAR | Status: DC | PRN
  Administered 2019-07-25: 3 mL

## 2019-07-25 MED ORDER — METOCLOPRAMIDE HCL 10 MG PO TABS: 5.0000 mg | ORAL_TABLET | Freq: Three times a day (TID) | ORAL | Status: DC | PRN

## 2019-07-25 MED ORDER — MIDAZOLAM HCL 5 MG/5ML IJ SOLN
INTRAMUSCULAR | Status: DC | PRN
  Administered 2019-07-25: 2 mg via INTRAVENOUS

## 2019-07-25 MED ORDER — TRETINOIN MICROSPHERE 0.08 % EX GEL: 1.0000 | Freq: Every day | CUTANEOUS | Status: DC

## 2019-07-25 MED ORDER — FAMOTIDINE 20 MG PO TABS
20.0000 mg | ORAL_TABLET | Freq: Once | ORAL | Status: AC
  Administered 2019-07-25: 09:00:00 20 mg via ORAL

## 2019-07-25 MED ORDER — ALPRAZOLAM 0.5 MG PO TABS: 0.5000 mg | ORAL_TABLET | Freq: Every day | ORAL | Status: DC | PRN

## 2019-07-25 MED ORDER — ACETAMINOPHEN 325 MG PO TABS
325.0000 mg | ORAL_TABLET | Freq: Four times a day (QID) | ORAL | Status: DC | PRN
  Administered 2019-07-26 – 2019-07-28 (×4): 650 mg via ORAL
  Filled 2019-07-25 (×4): qty 2

## 2019-07-25 MED ORDER — FENTANYL CITRATE (PF) 100 MCG/2ML IJ SOLN: 25.0000 ug | INTRAMUSCULAR | Status: DC | PRN

## 2019-07-25 MED ORDER — METOCLOPRAMIDE HCL 5 MG/ML IJ SOLN: 5.0000 mg | Freq: Three times a day (TID) | INTRAMUSCULAR | Status: DC | PRN

## 2019-07-25 MED ORDER — DOCUSATE SODIUM 100 MG PO CAPS
100.0000 mg | ORAL_CAPSULE | Freq: Two times a day (BID) | ORAL | Status: DC
  Administered 2019-07-25 – 2019-07-28 (×6): 100 mg via ORAL
  Filled 2019-07-25 (×6): qty 1

## 2019-07-25 MED ORDER — BISACODYL 10 MG RE SUPP: 10.0000 mg | Freq: Every day | RECTAL | Status: DC | PRN

## 2019-07-25 MED ORDER — HYDROMORPHONE HCL 1 MG/ML IJ SOLN
0.5000 mg | INTRAMUSCULAR | Status: DC | PRN
  Administered 2019-07-25: 0.5 mg via INTRAVENOUS
  Administered 2019-07-25: 16:00:00 1 mg via INTRAVENOUS
  Filled 2019-07-25 (×2): qty 1

## 2019-07-25 MED ORDER — METHOCARBAMOL 500 MG PO TABS
500.0000 mg | ORAL_TABLET | Freq: Four times a day (QID) | ORAL | Status: DC | PRN
  Administered 2019-07-25 – 2019-07-26 (×2): 500 mg via ORAL
  Filled 2019-07-25 (×2): qty 1

## 2019-07-25 MED ORDER — ONDANSETRON HCL 4 MG/2ML IJ SOLN
4.0000 mg | Freq: Four times a day (QID) | INTRAMUSCULAR | Status: DC | PRN
  Administered 2019-07-25: 4 mg via INTRAVENOUS
  Filled 2019-07-25: qty 2

## 2019-07-25 MED ORDER — ONDANSETRON HCL 4 MG/2ML IJ SOLN: 4.0000 mg | Freq: Once | INTRAMUSCULAR | Status: DC | PRN

## 2019-07-25 MED ORDER — CEFAZOLIN SODIUM-DEXTROSE 2-4 GM/100ML-% IV SOLN: 2.0000 g | INTRAVENOUS | Status: DC

## 2019-07-25 MED ORDER — MAGNESIUM CITRATE PO SOLN
1.0000 | Freq: Once | ORAL | Status: AC | PRN
  Administered 2019-07-28: 07:00:00 1 via ORAL
  Filled 2019-07-25: qty 296

## 2019-07-25 MED ORDER — PROPOFOL 500 MG/50ML IV EMUL
INTRAVENOUS | Status: DC | PRN
  Administered 2019-07-25: 10 ug/kg/min via INTRAVENOUS

## 2019-07-25 MED ORDER — MAGNESIUM HYDROXIDE 400 MG/5ML PO SUSP
30.0000 mL | Freq: Every day | ORAL | Status: DC | PRN
  Administered 2019-07-27 (×2): 30 mL via ORAL
  Filled 2019-07-25 (×2): qty 30

## 2019-07-25 MED ORDER — LACTATED RINGERS IV SOLN
INTRAVENOUS | Status: DC
  Administered 2019-07-25: 09:00:00 via INTRAVENOUS

## 2019-07-25 MED ORDER — FAMOTIDINE 20 MG PO TABS
ORAL_TABLET | ORAL | Status: AC
  Administered 2019-07-25: 20 mg via ORAL
  Filled 2019-07-25: qty 1

## 2019-07-25 MED ORDER — SODIUM CHLORIDE 0.9 % IV SOLN
INTRAVENOUS | Status: DC | PRN
  Administered 2019-07-25: 60 mL

## 2019-07-25 MED ORDER — ALUM & MAG HYDROXIDE-SIMETH 200-200-20 MG/5ML PO SUSP: 30.0000 mL | ORAL | Status: DC | PRN

## 2019-07-25 MED ORDER — MIDAZOLAM HCL 2 MG/2ML IJ SOLN
INTRAMUSCULAR | Status: AC
  Filled 2019-07-25: qty 2

## 2019-07-25 MED ORDER — TRAMADOL HCL 50 MG PO TABS
50.0000 mg | ORAL_TABLET | Freq: Four times a day (QID) | ORAL | Status: DC
  Administered 2019-07-25 – 2019-07-27 (×10): 50 mg via ORAL
  Filled 2019-07-25 (×11): qty 1

## 2019-07-25 SURGICAL SUPPLY — 73 items
BLADE SAGITTAL AGGR TOOTH XLG (BLADE) ×3 IMPLANT
BNDG COHESIVE 6X5 TAN STRL LF (GAUZE/BANDAGES/DRESSINGS) ×9 IMPLANT
CANISTER SUCT 1200ML W/VALVE (MISCELLANEOUS) ×3 IMPLANT
CANISTER SUCT 3000ML PPV (MISCELLANEOUS) ×3 IMPLANT
CANISTER WOUND CARE 500ML ATS (WOUND CARE) ×3 IMPLANT
CHLORAPREP W/TINT 26 (MISCELLANEOUS) ×3 IMPLANT
COVER BACK TABLE REUSABLE LG (DRAPES) ×3 IMPLANT
COVER WAND RF STERILE (DRAPES) ×3 IMPLANT
DRAPE 3/4 80X56 (DRAPES) ×9 IMPLANT
DRAPE C-ARM XRAY 36X54 (DRAPES) ×3 IMPLANT
DRAPE INCISE IOBAN 66X60 STRL (DRAPES) ×3 IMPLANT
DRAPE POUCH INSTRU U-SHP 10X18 (DRAPES) ×3 IMPLANT
DRESSING SURGICEL FIBRLLR 1X2 (HEMOSTASIS) ×2 IMPLANT
DRSG OPSITE POSTOP 4X8 (GAUZE/BANDAGES/DRESSINGS) ×6 IMPLANT
DRSG SURGICEL FIBRILLAR 1X2 (HEMOSTASIS) ×6
ELECT BLADE 6.5 EXT (BLADE) ×3 IMPLANT
ELECT CAUTERY BLADE 6.4 (BLADE) ×3 IMPLANT
ELECT REM PT RETURN 9FT ADLT (ELECTROSURGICAL) ×3
ELECTRODE REM PT RTRN 9FT ADLT (ELECTROSURGICAL) ×1 IMPLANT
GAUZE SPONGE 4X4 12PLY STRL (GAUZE/BANDAGES/DRESSINGS) ×3 IMPLANT
GAUZE XEROFORM 1X8 LF (GAUZE/BANDAGES/DRESSINGS) ×3 IMPLANT
GLOVE BIOGEL PI IND STRL 9 (GLOVE) ×1 IMPLANT
GLOVE BIOGEL PI INDICATOR 9 (GLOVE) ×2
GLOVE SURG SYN 9.0  PF PI (GLOVE) ×4
GLOVE SURG SYN 9.0 PF PI (GLOVE) ×2 IMPLANT
GOWN SRG 2XL LVL 4 RGLN SLV (GOWNS) ×1 IMPLANT
GOWN STRL NON-REIN 2XL LVL4 (GOWNS) ×2
GOWN STRL REUS W/ TWL LRG LVL3 (GOWN DISPOSABLE) ×1 IMPLANT
GOWN STRL REUS W/TWL LRG LVL3 (GOWN DISPOSABLE) ×2
HEAD FEMORAL 28MM SZ S (Head) ×3 IMPLANT
HEMOVAC 400CC 10FR (MISCELLANEOUS) IMPLANT
HOLDER FOLEY CATH W/STRAP (MISCELLANEOUS) ×3 IMPLANT
HOOD PEEL AWAY FLYTE STAYCOOL (MISCELLANEOUS) ×3 IMPLANT
KIT PREVENA INCISION MGT 13 (CANNISTER) ×3 IMPLANT
KIT TURNOVER KIT A (KITS) ×3 IMPLANT
LINER DUAL MOB 50MM (Liner) ×3 IMPLANT
MAT ABSORB  FLUID 56X50 GRAY (MISCELLANEOUS) ×2
MAT ABSORB FLUID 56X50 GRAY (MISCELLANEOUS) ×1 IMPLANT
NDL SAFETY ECLIPSE 18X1.5 (NEEDLE) ×1 IMPLANT
NEEDLE FILTER BLUNT 18X 1/2SAF (NEEDLE) ×2
NEEDLE FILTER BLUNT 18X1 1/2 (NEEDLE) ×1 IMPLANT
NEEDLE HYPO 18GX1.5 SHARP (NEEDLE) ×2
NEEDLE SPNL 20GX3.5 QUINCKE YW (NEEDLE) ×6 IMPLANT
NS IRRIG 1000ML POUR BTL (IV SOLUTION) ×3 IMPLANT
PACK HIP COMPR (MISCELLANEOUS) ×3 IMPLANT
PACK HIP PROSTHESIS (MISCELLANEOUS) ×3 IMPLANT
PENCIL SMOKE EVACUATOR COATED (MISCELLANEOUS) ×3 IMPLANT
SCALPEL PROTECTED #10 DISP (BLADE) ×6 IMPLANT
SHELL ACETABULAR SZ0 50 DME (Shell) ×3 IMPLANT
SOL PREP PVP 2OZ (MISCELLANEOUS) ×3
SOLUTION PREP PVP 2OZ (MISCELLANEOUS) ×1 IMPLANT
SPONGE DRAIN TRACH 4X4 STRL 2S (GAUZE/BANDAGES/DRESSINGS) ×3 IMPLANT
STAPLER SKIN PROX 35W (STAPLE) ×3 IMPLANT
STEM FEMORAL SZ3  STD COLLARED (Stem) ×3 IMPLANT
STRAP SAFETY 5IN WIDE (MISCELLANEOUS) ×3 IMPLANT
SUT DVC 2 QUILL PDO  T11 36X36 (SUTURE) ×2
SUT DVC 2 QUILL PDO T11 36X36 (SUTURE) ×1 IMPLANT
SUT SILK 0 (SUTURE) ×2
SUT SILK 0 30XBRD TIE 6 (SUTURE) ×1 IMPLANT
SUT V-LOC 90 ABS DVC 3-0 CL (SUTURE) ×3 IMPLANT
SUT VIC AB 0 CT1 27 (SUTURE) ×2
SUT VIC AB 0 CT1 27XCR 8 STRN (SUTURE) ×1 IMPLANT
SUT VIC AB 1 CT1 36 (SUTURE) ×3 IMPLANT
SUT VIC AB 2-0 CT1 27 (SUTURE) ×2
SUT VIC AB 2-0 CT1 TAPERPNT 27 (SUTURE) ×1 IMPLANT
SYR 10ML LL (SYRINGE) ×3 IMPLANT
SYR 20ML LL LF (SYRINGE) ×3 IMPLANT
SYR 30ML LL (SYRINGE) ×3 IMPLANT
SYR 50ML LL SCALE MARK (SYRINGE) ×6 IMPLANT
SYR BULB IRRIG 60ML STRL (SYRINGE) ×3 IMPLANT
TAPE MICROFOAM 4IN (TAPE) ×3 IMPLANT
TOWEL OR 17X26 4PK STRL BLUE (TOWEL DISPOSABLE) ×3 IMPLANT
TRAY FOLEY MTR SLVR 16FR STAT (SET/KITS/TRAYS/PACK) ×3 IMPLANT

## 2019-07-25 NOTE — Anesthesia Procedure Notes (Addendum)
Spinal  Patient location during procedure: OR Start time: 07/25/2019 10:16 AM End time: 07/25/2019 10:24 AM Staffing Resident/CRNA: Nelda Marseille, CRNA Performed: resident/CRNA  Preanesthetic Checklist Completed: patient identified, site marked, surgical consent, pre-op evaluation, timeout performed, IV checked, risks and benefits discussed and monitors and equipment checked Spinal Block Patient position: sitting Prep: Betadine Patient monitoring: heart rate, continuous pulse ox, blood pressure and cardiac monitor Approach: midline Location: L3-4 Injection technique: single-shot Needle Needle type: Whitacre and Introducer  Needle gauge: 25 G Needle length: 9 cm Assessment Sensory level: T10 Additional Notes Negative paresthesia. Negative blood return. Positive free-flowing CSF. Expiration date of kit checked and confirmed. Patient tolerated procedure well, without complications.

## 2019-07-25 NOTE — Evaluation (Signed)
Physical Therapy Evaluation Patient Details Name: Grace Moreno MRN: [id number] DOB: 1971-01-08 Today's Date: 07/25/2019   History of Present Illness  Pt is a 48 yo female diagnosed with OA of the left hip and is s/p elective L THA.  PMH includes HTN and anxiety.  Clinical Impression  Pt presented with deficits in strength, transfers, mobility, gait, balance, and activity tolerance.  Pt limited by N&V intermittently during the session, nursing notified.  Pt required min A with bed mobility and CGA with transfers.  Pt c/o dizziness with BP taken at 100/68 and returned to supine, nursing notified.  Pt anticipated to progress well during acute care stay once N&V resolves and will benefit from HHPT services upon discharge to safely address above deficits for decreased caregiver assistance and eventual return to PLOF.        Follow Up Recommendations Home health PT    Equipment Recommendations  Rolling walker with 5" wheels;3in1 (PT)    Recommendations for Other Services       Precautions / Restrictions Precautions Precautions: Anterior Hip Precaution Booklet Issued: Yes (comment) Restrictions Weight Bearing Restrictions: Yes LLE Weight Bearing: Weight bearing as tolerated      Mobility  Bed Mobility Overal bed mobility: Needs Assistance Bed Mobility: Supine to Sit;Sit to Supine     Supine to sit: Min assist Sit to supine: Min assist   General bed mobility comments: Min A for LLE control  Transfers Overall transfer level: Needs assistance Equipment used: Rolling walker (2 wheeled) Transfers: Sit to/from Stand Sit to Stand: Min guard         General transfer comment: Good eccentric and concentric control during sit to/from stand  Ambulation/Gait             General Gait Details: NT secondary to pt dizzy with nausea in standing  Stairs            Wheelchair Mobility    Modified Rankin (Stroke Patients Only)       Balance Overall balance assessment:  Needs assistance Sitting-balance support: Single extremity supported;Feet supported Sitting balance-Leahy Scale: Good     Standing balance support: Bilateral upper extremity supported Standing balance-Leahy Scale: Fair                               Pertinent Vitals/Pain Pain Assessment: 0-10 Pain Score: 5  Pain Location: L hip Pain Descriptors / Indicators: Aching;Sore Pain Intervention(s): Premedicated before session;Monitored during session;Limited activity within patient's tolerance    Home Living Family/patient expects to be discharged to:: Private residence Living Arrangements: Spouse/significant other;Children Available Help at Discharge: Family;Available 24 hours/day Type of Home: House Home Access: Stairs to enter Entrance Stairs-Rails: None Entrance Stairs-Number of Steps: 2 Home Layout: One level Home Equipment: Crutches      Prior Function Level of Independence: Independent         Comments: Ind with amb without an AD, walks 3-4 miles per day, no fall history, Ind with ADLs     Hand Dominance        Extremity/Trunk Assessment   Upper Extremity Assessment Upper Extremity Assessment: Generalized weakness    Lower Extremity Assessment Lower Extremity Assessment: Generalized weakness;LLE deficits/detail LLE Deficits / Details: Good B ankle DF and PF strength against manual resistance LLE: Unable to fully assess due to pain LLE Sensation: WNL       Communication   Communication: No difficulties  Cognition Arousal/Alertness: Awake/alert Behavior During Therapy:  WFL for tasks assessed/performed Overall Cognitive Status: Within Functional Limits for tasks assessed                                        General Comments      Exercises Total Joint Exercises Ankle Circles/Pumps: Strengthening;AROM;Both;10 reps Quad Sets: Strengthening;Both;10 reps Gluteal Sets: Strengthening;Both;10 reps Hip ABduction/ADduction:  AROM;Both;5 reps Long Arc Quad: AROM;Strengthening;Both;10 reps Knee Flexion: AROM;Strengthening;Both;10 reps Other Exercises Other Exercises: Weight shifting left/right in standing Other Exercises: HEP education and review per handout   Assessment/Plan    PT Assessment Patient needs continued PT services  PT Problem List Decreased strength;Decreased activity tolerance;Decreased balance;Decreased mobility;Decreased knowledge of use of DME       PT Treatment Interventions DME instruction;Gait training;Stair training;Functional mobility training;Therapeutic activities;Therapeutic exercise;Balance training;Patient/family education    PT Goals (Current goals can be found in the Care Plan section)  Acute Rehab PT Goals Patient Stated Goal: To get stronger PT Goal Formulation: With patient Time For Goal Achievement: 08/07/19 Potential to Achieve Goals: Good    Frequency BID   Barriers to discharge        Co-evaluation               AM-PAC PT "6 Clicks" Mobility  Outcome Measure Help needed turning from your back to your side while in a flat bed without using bedrails?: A Little Help needed moving from lying on your back to sitting on the side of a flat bed without using bedrails?: A Little Help needed moving to and from a bed to a chair (including a wheelchair)?: A Little Help needed standing up from a chair using your arms (e.g., wheelchair or bedside chair)?: A Little Help needed to walk in hospital room?: A Little Help needed climbing 3-5 steps with a railing? : A Lot 6 Click Score: 17    End of Session Equipment Utilized During Treatment: Gait belt Activity Tolerance: Other (comment)(limited by nausea) Patient left: in bed;with call bell/phone within reach;with bed alarm set;with SCD's reapplied;with family/visitor present;with nursing/sitter in room Nurse Communication: Mobility status;Other (comment)(Pt dizzy in sitting/standing with N&V) PT Visit Diagnosis: Other  abnormalities of gait and mobility (R26.89);Muscle weakness (generalized) (M62.81)    Time: [id number] PT Time Calculation (min) (ACUTE ONLY): 49 min   Charges:   PT Evaluation $PT Eval Moderate Complexity: 1 Mod PT Treatments $Therapeutic Exercise: 8-22 mins        D. Royetta Asal PT, DPT 07/25/19, 4:53 PM

## 2019-07-25 NOTE — Progress Notes (Signed)
Pharmacy Antibiotic Note  Grace Moreno is a 48 y.o. female admitted on (Not on file) with surgical prophylaxis.  Pharmacy has been consulted for cefazolin dosing.  Plan: Order already placed for cefazolin 2g IV x 1 for surgical prophylaxis     No data recorded.  No results for input(s): WBC, CREATININE, LATICACIDVEN, VANCOTROUGH, VANCOPEAK, VANCORANDOM, GENTTROUGH, GENTPEAK, GENTRANDOM, TOBRATROUGH, TOBRAPEAK, TOBRARND, AMIKACINPEAK, AMIKACINTROU, AMIKACIN in the last 168 hours.  Estimated Creatinine Clearance: 85.4 mL/min (by C-G formula based on SCr of 0.75 mg/dL).    Allergies  Allergen Reactions  . Pramipexole Dihydrochloride Nausea And Vomiting and Other (See Comments)    "fainting    Thank you for allowing pharmacy to be a part of this patient's care.  Tobie Lords, PharmD, BCPS Clinical Pharmacist 07/25/2019 7:20 AM

## 2019-07-25 NOTE — Op Note (Signed)
07/25/2019  11:54 AM  PATIENT:  Grace Moreno  48 y.o. female  PRE-OPERATIVE DIAGNOSIS:  PRIMARY OSTEOARTHRITIS OF LEFT HIP trochanteric bursitis left hip  POST-OPERATIVE DIAGNOSIS:  PRIMARY OSTEOARTHRITIS LEFT HIP trochanteric bursitis left hip  PROCEDURE:  Procedure(s): TOTAL HIP ARTHROPLASTY ANTERIOR APPROACH, ADD BURSECTOMY (Left) EXCISION/RELEASE BURSA HIP (Left)  SURGEON: Laurene Footman, MD  ASSISTANTS: None  ANESTHESIA:   spinal  EBL:  Total I/O In: 600 [I.V.:600] Out: 225 [Urine:125; Blood:100]  BLOOD ADMINISTERED:none  DRAINS: Incisional wound VAC applied   LOCAL MEDICATIONS USED:  MARCAINE    and OTHER Exparel  SPECIMEN:  Source of Specimen:  Left femoral head  DISPOSITION OF SPECIMEN:  PATHOLOGY  COUNTS:  YES  TOURNIQUET:  * No tourniquets in log *  IMPLANTS: Medacta AMIS 3 standard with ceramic S 28 mm head, 50 mm Mpact TM cup and liner  DICTATION: .Dragon Dictation   The patient was brought to the operating room and after spinal anesthesia was obtained patient was placed on the operative table with the ipsilateral foot into the Medacta attachment, contralateral leg on a well-padded table. C-arm was brought in and preop template x-ray taken. After prepping and draping in usual sterile fashion appropriate patient identification and timeout procedures were completed. Anterior approach to the hip was obtained and centered over the greater trochanter and TFL muscle. The subcutaneous tissue was incised hemostasis being achieved by electrocautery. TFL fascia was incised and the muscle retracted laterally deep retractor placed. The lateral femoral circumflex vessels were identified and ligated. The anterior capsule was exposed and a capsulotomy performed. The neck was identified and a femoral neck cut carried out with a saw. The head was removed without difficulty and showed sclerotic femoral head and acetabulum. Reaming was carried out to 50 mm and a 50 mm cup trial gave  appropriate tightness to the acetabular component a 50 DM cup was impacted into position.  A Cobra retractor was placed beneath the tensor and the leg was put into extreme internal rotation there was extensive bursitis present which was debrided with a rongeur and a bony spur which is also removed with use of a rongeur.  After this was addressed the femur was approached in the standard fashion.  The leg was then externally rotated and ischiofemoral and pubofemoral releases carried out. The femur was sequentially broached to a size 3, size 3 standard with S head trials were placed and the final components chosen. The 3 standard stem was inserted along with a ceramic S 28 mm head and 50 mm liner. The hip was reduced and was stable the wound was thoroughly irrigated with fibrillar placed along the posterior capsule and medial neck. The deep fascia ws closed using a heavy Quill after infiltration of 30 cc of quarter percent Sensorcaine with epinephrine.  Exparel was injected throughout the case to aid in postop analgesia .3-0 V-loc to close the skin with skin staples.  With incisional wound VAC applied, e patient was sent to recovery in stable condition.   PLAN OF CARE: Admit to inpatient

## 2019-07-25 NOTE — TOC Progression Note (Signed)
Transition of Care [facility]) - Progression Note    Patient Details  Name: DONETTE SLAPE MRN: [id number] Date of Birth: 1971/05/30  Transition of Care [facility]) CM/SW [place], RN Phone Number: 07/25/2019, 3:30 PM  Clinical Narrative:     Requested Lovenox price       Expected Discharge Plan and Services                                                 Social Determinants of Health (SDOH) Interventions    Readmission Risk Interventions No flowsheet data found.

## 2019-07-25 NOTE — Transfer of Care (Signed)
Immediate Anesthesia Transfer of Care Note  Patient: Grace Moreno  Procedure(s) Performed: TOTAL HIP ARTHROPLASTY ANTERIOR APPROACH, ADD BURSECTOMY (Left Hip) EXCISION/RELEASE BURSA HIP (Left Hip)  Patient Location: PACU  Anesthesia Type:Spinal  Level of Consciousness: sedated  Airway & Oxygen Therapy: Patient Spontanous Breathing and Patient connected to face mask oxygen  Post-op Assessment: Report given to RN and Post -op Vital signs reviewed and stable  Post vital signs: Reviewed and stable  Last Vitals:  Vitals Value Taken Time  BP 84/55 07/25/19 1155  Temp    Pulse 50 07/25/19 1156  Resp 12 07/25/19 1156  SpO2 98 % 07/25/19 1156  Vitals shown include unvalidated device data.  Last Pain:  Vitals:   07/25/19 0843  TempSrc: Tympanic  PainSc: 3       Patients Stated Pain Goal: 0 ([id number])  Complications: No apparent anesthesia complications

## 2019-07-25 NOTE — H&P (Signed)
Reviewed paper H+P, will be scanned into chart. No changes noted.  

## 2019-07-25 NOTE — Anesthesia Procedure Notes (Signed)
Date/Time: 07/25/2019 10:50 AM Performed by: Nelda Marseille, CRNA Pre-anesthesia Checklist: Patient identified, Emergency Drugs available, Suction available, Patient being monitored and Timeout performed Oxygen Delivery Method: Simple face mask

## 2019-07-25 NOTE — Anesthesia Preprocedure Evaluation (Addendum)
Anesthesia Evaluation  Patient identified by MRN, date of birth, ID band Patient awake    Reviewed: Allergy & Precautions, NPO status , Patient's Chart, lab work & pertinent test results  History of Anesthesia Complications Negative for: history of anesthetic complications  Airway Mallampati: I  TM Distance: >3 FB Neck ROM: Full    Dental no notable dental hx.    Pulmonary neg pulmonary ROS, neg COPD,    Pulmonary exam normal - rhonchi (-) wheezing      Cardiovascular hypertension, Pt. on medications (-) angina(-) CAD and (-) Past MI  Rhythm:Regular - Systolic murmurs and - Diastolic murmurs    Neuro/Psych Anxiety negative neurological ROS     GI/Hepatic negative GI ROS, Neg liver ROS,   Endo/Other  negative endocrine ROSneg diabetes  Renal/GU negative Renal ROS     Musculoskeletal negative musculoskeletal ROS (+)   Abdominal (+) - obese,   Peds  Hematology negative hematology ROS (+)   Anesthesia Other Findings   Reproductive/Obstetrics                             Anesthesia Physical  Anesthesia Plan  ASA: II  Anesthesia Plan: Spinal   Post-op Pain Management:    Induction:   PONV Risk Score and Plan:   Airway Management Planned: Nasal Cannula  Additional Equipment:   Intra-op Plan:   Post-operative Plan:   Informed Consent: I have reviewed the patients History and Physical, chart, labs and discussed the procedure including the risks, benefits and alternatives for the proposed anesthesia with the patient or authorized representative who has indicated his/her understanding and acceptance.     Dental advisory given  Plan Discussed with: Anesthesiologist and CRNA  Anesthesia Plan Comments:         Anesthesia Quick Evaluation

## 2019-07-25 NOTE — Progress Notes (Signed)
15 minute call to floor. 

## 2019-07-25 NOTE — Plan of Care (Signed)
  Problem: Education: Goal: Knowledge of General Education information will improve Description: Including pain rating scale, medication(s)/side effects and non-pharmacologic comfort measures Outcome: Progressing   Problem: Health Behavior/Discharge Planning: Goal: Ability to manage health-related needs will improve Outcome: Progressing   Problem: Clinical Measurements: Goal: Ability to maintain clinical measurements within normal limits will improve Outcome: Progressing   Problem: Activity: Goal: Risk for activity intolerance will decrease Outcome: Progressing   Problem: Coping: Goal: Level of anxiety will decrease Outcome: Progressing   Problem: Elimination: Goal: Will not experience complications related to bowel motility Outcome: Progressing   

## 2019-07-25 NOTE — Anesthesia Post-op Follow-up Note (Signed)
Anesthesia QCDR form completed.        

## 2019-07-26 ENCOUNTER — Encounter: Payer: Self-pay | Admitting: Orthopedic Surgery

## 2019-07-26 LAB — BASIC METABOLIC PANEL
Anion gap: 9 (ref 5–15)
BUN: 15 mg/dL (ref 6–20)
CO2: 25 mmol/L (ref 22–32)
Calcium: 8.5 mg/dL — ABNORMAL LOW (ref 8.9–10.3)
Chloride: 101 mmol/L (ref 98–111)
Creatinine, Ser: 0.79 mg/dL (ref 0.44–1.00)
GFR calc Af Amer: >60 mL/min (ref >60)
GFR calc non Af Amer: >60 mL/min (ref >60)
Glucose, Bld: 111 mg/dL — ABNORMAL HIGH (ref 70–99)
Potassium: 4 mmol/L (ref 3.5–5.1)
Sodium: 135 mmol/L (ref 135–145)

## 2019-07-26 LAB — CBC
HCT: 32.5 % — ABNORMAL LOW (ref 36.0–46.0)
Hemoglobin: 10.7 g/dL — ABNORMAL LOW (ref 12.0–15.0)
MCH: 29.5 pg (ref 26.0–34.0)
MCHC: 32.9 g/dL (ref 30.0–36.0)
MCV: 89.5 fL (ref 80.0–100.0)
Platelets: 211 10*3/uL (ref 150–400)
RBC: 3.63 MIL/uL — ABNORMAL LOW (ref 3.87–5.11)
RDW: 11.2 % — ABNORMAL LOW (ref 11.5–15.5)
WBC: 9.2 10*3/uL (ref 4.0–10.5)
nRBC: 0 % (ref 0.0–0.2)

## 2019-07-26 LAB — SURGICAL PATHOLOGY

## 2019-07-26 MED ORDER — SODIUM CHLORIDE 0.9 % IV BOLUS
1000.0000 mL | Freq: Once | INTRAVENOUS | Status: AC
  Administered 2019-07-26: 12:00:00 1000 mL via INTRAVENOUS

## 2019-07-26 MED ORDER — SODIUM CHLORIDE 0.9% FLUSH
3.0000 mL | Freq: Two times a day (BID) | INTRAVENOUS | Status: DC
  Administered 2019-07-26 – 2019-07-27 (×3): 3 mL via INTRAVENOUS

## 2019-07-26 NOTE — Progress Notes (Signed)
D: Pt alert and oriented. Pt's had to orthostatic episodes with PT. Physician was notified, bolus was ordered and give.  A: Scheduled medications administered to pt, per MD orders. Support and encouragement provided. Frequent verbal contact made.    R: No adverse drug reactions noted. Pt complaint with medications and treatment plan. Pt interacts well with staff on the unit. Pt is stable, will continue to monitor and provide care as ordered.

## 2019-07-26 NOTE — Anesthesia Postprocedure Evaluation (Signed)
Anesthesia Post Note  Patient: Grace Moreno  Procedure(s) Performed: TOTAL HIP ARTHROPLASTY ANTERIOR APPROACH, ADD BURSECTOMY (Left Hip) EXCISION/RELEASE BURSA HIP (Left Hip)  Patient location during evaluation: Nursing Unit Anesthesia Type: Spinal Level of consciousness: oriented and awake and alert Pain management: pain level controlled Vital Signs Assessment: post-procedure vital signs reviewed and stable Respiratory status: spontaneous breathing and respiratory function stable Cardiovascular status: blood pressure returned to baseline and stable Postop Assessment: no headache, no backache, no apparent nausea or vomiting and patient able to bend at knees Anesthetic complications: no     Last Vitals:  Vitals:   07/26/19 0424 07/26/19 0741  BP: 103/66 105/64  Pulse: 63 71  Resp: 18 18  Temp: 36.9 C 37.1 C  SpO2: 99% 99%    Last Pain:  Vitals:   07/26/19 0943  TempSrc:   PainSc: [place]

## 2019-07-26 NOTE — Progress Notes (Signed)
Physical Therapy Treatment Patient Details Name: Grace Moreno MRN: [id number] DOB: 04-13-1971 Today's Date: 07/26/2019    History of Present Illness Pt is a 48 yo female diagnosed with OA of the left hip and is s/p elective L THA.  PMH includes HTN and anxiety.    PT Comments    Pt presented with deficits in strength, transfers, mobility, gait, balance, and activity tolerance but has been primarily limited by orthostatic hypotension followed by syncopal or near syncopal episodes during the AM and PM sessions this date.  Pt's orthostatic BPs taken this session with supine 111/67, sitting 102/63, and standing 78/53.  Pt unable to remain standing for the BP to completely finish secondary to onset of significant symptoms including feeling hot flash, dizzy, and nauseous.  All symptoms quickly resolved once pt returned to supine, nursing and MD notified.  Pt is expected to make very good progress towards goals once she is able to tolerate standing activities and will benefit from HHPT upon discharge from acute care to address the above deficits for decreased caregiver assistance and return to PLOF.       Follow Up Recommendations  Home health PT     Equipment Recommendations  Rolling walker with 5" wheels;3in1 (PT)    Recommendations for Other Services       Precautions / Restrictions Precautions Precautions: Anterior Hip Precaution Booklet Issued: Yes (comment) Restrictions Weight Bearing Restrictions: Yes LLE Weight Bearing: Weight bearing as tolerated Other Position/Activity Restrictions: Pt has been orthostatic in sitting/standing with syncopal and near syncopal episodes    Mobility  Bed Mobility Overal bed mobility: Needs Assistance Bed Mobility: Supine to Sit;Sit to Supine     Supine to sit: Supervision Sit to supine: Min assist   General bed mobility comments: Min A for LLE control  Transfers Overall transfer level: Needs assistance Equipment used: Rolling walker (2  wheeled) Transfers: Sit to/from Stand Sit to Stand: Min guard         General transfer comment: Good eccentric and concentric control during sit to/from stand  Ambulation/Gait             General Gait Details: NT secondary to pt with near syncopal episode in standing   Stairs             Wheelchair Mobility    Modified Rankin (Stroke Patients Only)       Balance Overall balance assessment: Needs assistance Sitting-balance support: Single extremity supported;Feet supported Sitting balance-Leahy Scale: Good     Standing balance support: Bilateral upper extremity supported Standing balance-Leahy Scale: Fair                              Cognition Arousal/Alertness: Awake/alert Behavior During Therapy: WFL for tasks assessed/performed Overall Cognitive Status: Within Functional Limits for tasks assessed                                        Exercises Total Joint Exercises Ankle Circles/Pumps: Strengthening;AROM;Both;10 reps;15 reps Quad Sets: Strengthening;Both;10 reps;15 reps Gluteal Sets: Strengthening;Both;10 reps;15 reps Towel Squeeze: Strengthening;Both;10 reps Heel Slides: AROM;Both;5 reps Hip ABduction/ADduction: AROM;AAROM;Both;10 reps Straight Leg Raises: AAROM;AROM;Both;10 reps Long Arc Quad: AROM;Strengthening;Both;10 reps;15 reps Knee Flexion: AROM;Strengthening;Both;10 reps;15 reps Bridges: AROM;Both;5 reps Other Exercises Other Exercises: HEP education and review per handout with pt and spouse Other Exercises: Car transfer sequencing education with visual  demonstration using room chair to simulate car with pt and spouse present Other Exercises: Anterior hip precaution education, avoiding 'fencing' position    General Comments        Pertinent Vitals/Pain Pain Assessment: 0-10 Pain Score: 5  Pain Location: L hip Pain Descriptors / Indicators: Aching;Sore Pain Intervention(s): Premedicated before  session;Monitored during session    Home Living                      Prior Function            PT Goals (current goals can now be found in the care plan section) Progress towards PT goals: Not progressing toward goals - comment(Pt limited by orthostatic hypotension)    Frequency    BID      PT Plan Current plan remains appropriate    Co-evaluation              AM-PAC PT "6 Clicks" Mobility   Outcome Measure  Help needed turning from your back to your side while in a flat bed without using bedrails?: A Little Help needed moving from lying on your back to sitting on the side of a flat bed without using bedrails?: A Little Help needed moving to and from a bed to a chair (including a wheelchair)?: A Little Help needed standing up from a chair using your arms (e.g., wheelchair or bedside chair)?: A Little Help needed to walk in hospital room?: A Lot Help needed climbing 3-5 steps with a railing? : A Lot 6 Click Score: 16    End of Session Equipment Utilized During Treatment: Gait belt Activity Tolerance: Treatment limited secondary to medical complications (Comment) Patient left: in bed;with call bell/phone within reach;with bed alarm set;with SCD's reapplied;with family/visitor present Nurse Communication: Mobility status;Other (comment)(Orthostatic BPs and symptoms communicated to nsg and MD) PT Visit Diagnosis: Other abnormalities of gait and mobility (R26.89);Muscle weakness (generalized) (M62.81)     Time: 1540-1620 PT Time Calculation (min) (ACUTE ONLY): 40 min  Charges:  $Therapeutic Exercise: 23-37 mins $Therapeutic Activity: 8-22 mins                     D. Scott Mataeo Ingwersen PT, DPT 07/26/19, 4:50 PM

## 2019-07-26 NOTE — TOC Initial Note (Signed)
Transition of Care [facility]) - Initial/Assessment Note    Patient Details  Name: Grace Moreno MRN: [id number] Date of Birth: 24-Mar-1971  Transition of Care [facility]) CM/SW Contact:    Su Hilt, RN Phone Number: 07/26/2019, 12:11 PM  Clinical Narrative:                 Met with the patient and her husband in the room to discuss DC plan and needs She lives at home with her husband and he will provide transportation She needs a RW and a BSC, I notified Leroy Sea  She would like to use [facility] PT I notified Helene Kelp  The Lovenox is $5 and she can afford her medications She is up to date with her PCP No additional needs at this time  Expected Discharge Plan: [place] Barriers to Discharge: Continued Medical Work up   Patient Goals and CMS Choice Patient states their goals for this hospitalization and ongoing recovery are:: go home CMS Medicare.gov Compare Post Acute Care list provided to:: Patient Choice offered to / list presented to : Patient  Expected Discharge Plan and Services Expected Discharge Plan: [place]   Discharge Planning Services: CM Consult Post Acute Care Choice: [place] arrangements for the past 2 months: Single Family Home                 DME Arranged: 3-N-1, Walker rolling DME Agency: [employer] Date DME Agency Contacted: 07/26/19 Time DME Agency Contacted: [id number] Representative spoke with at DME Agency: [place]: PT [place]: [facility] (formerly [employer]) Date [place]: 07/26/19 Time [place]: 1210 Representative spoke with at [place]: [place] Arrangements/Services Living arrangements for the past 2 months: [place] Lives with:: Spouse Patient language and need for interpreter reviewed:: Yes Do you feel safe going back to the place where you live?: Yes      Need for Family Participation in Patient Care: No (Comment) Care giver  support system in place?: Yes (comment)   Criminal Activity/Legal Involvement Pertinent to Current Situation/Hospitalization: No - Comment as needed  Activities of Daily Living Home Assistive Devices/Equipment: None ADL Screening (condition at time of admission) Patient's cognitive ability adequate to safely complete daily activities?: Yes Is the patient deaf or have difficulty hearing?: Yes Does the patient have difficulty seeing, even when wearing glasses/contacts?: No Does the patient have difficulty concentrating, remembering, or making decisions?: No Patient able to express need for assistance with ADLs?: Yes Does the patient have difficulty dressing or bathing?: No Independently performs ADLs?: No Communication: Independent Dressing (OT): Needs assistance Is this a change from baseline?: Change from baseline, expected to last <3days Grooming: Independent Feeding: Independent Bathing: Needs assistance Is this a change from baseline?: Change from baseline, expected to last <3 days Toileting: Needs assistance Is this a change from baseline?: Change from baseline, expected to last <3 days In/Out Bed: Needs assistance Is this a change from baseline?: Change from baseline, expected to last <3 days Walks in Home: Independent Does the patient have difficulty walking or climbing stairs?: No Weakness of Legs: Left Weakness of Arms/Hands: None  Permission Sought/Granted   Permission granted to share information with : Yes, Verbal Permission Granted              Emotional Assessment Appearance:: Appears stated age Attitude/Demeanor/Rapport: Engaged Affect (typically observed): Appropriate Orientation: : Oriented to Self, Oriented to Place,  Oriented to  Time, Oriented to Situation Alcohol / Substance Use: Not Applicable Psych Involvement: No (comment)  Admission diagnosis:  PRIMARY OSTEOARTHRITIS OF LEFT HIP Patient Active Problem List   Diagnosis Date Noted  . 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 CIN II (cervical intraepithelial neoplasia II) 04/20/2016  . Breast cyst 02/02/2013   PCP:  Tracie Harrier, MD Pharmacy:   [facility] [place], [place] [address] [place] [address] Phone: [phone] Fax: [phone]     Social Determinants of Health (SDOH) Interventions    Readmission Risk Interventions No flowsheet data found.

## 2019-07-26 NOTE — TOC Benefit Eligibility Note (Signed)
Transition of Care [facility]) Benefit Eligibility Note    Patient Details  Name: Grace Moreno MRN: [id number] Date of Birth: [birth year]   Medication/Dose: Enoxaparin 40mg  once daily for 14 days  Covered?: Yes  Tier: Other(Tier 1)  Prescription Coverage Preferred Pharmacy: [facility] with Person/Company/Phone Number:: Quita Skye with [employer] at [phone]  Co-Pay: $5.00 estimated copay  Prior Approval: No  Deductible: (No deductible)   Dannette Barbara Phone Number: [phone] or [phone] 07/26/2019, 8:49 AM

## 2019-07-26 NOTE — Progress Notes (Signed)
   Subjective: 1 Day Post-Op Procedure(s) (LRB): TOTAL HIP ARTHROPLASTY ANTERIOR APPROACH, ADD BURSECTOMY (Left) EXCISION/RELEASE BURSA HIP (Left) Patient reports pain as moderate.   Patient is well, and has had no acute complaints or problems Denies any CP, SOB, ABD pain. We will continue therapy today.  Plan is to go Home after hospital stay.  Objective: Vital signs in last 24 hours: Temp:  [97.6 F (36.4 C)-99.1 F (37.3 C)] 98.4 F (36.9 C) (11/04 0424) Pulse Rate:  [37-63] 63 (11/04 0424) Resp:  [11-18] 18 (11/04 0424) BP: (96-141)/(62-83) 103/66 (11/04 0424) SpO2:  [97 %-100 %] 99 % (11/04 0424) Weight:  [68 kg] 68 kg (11/03 0843)  Intake/Output from previous day: 11/03 0701 - 11/04 0700 In: 1409.3 [I.V.:1104.1; IV Piggyback:305.2] Out: 1425 [Urine:1325; Blood:100] Intake/Output this shift: Total I/O In: 804 [I.V.:504; IV Piggyback:300] Out: 800 [Urine:800]  Recent Labs    07/25/19 1345 07/26/19 0502  HGB 12.6 10.7*   Recent Labs    07/25/19 1345 07/26/19 0502  WBC 7.4 9.2  RBC 4.28 3.63*  HCT 38.5 32.5*  PLT 218 211   Recent Labs    07/25/19 1345 07/26/19 0502  NA  --  135  K  --  4.0  CL  --  101  CO2  --  25  BUN  --  15  CREATININE 0.75 0.79  GLUCOSE  --  111*  CALCIUM  --  8.5*   No results for input(s): LABPT, INR in the last 72 hours.  EXAM General - Patient is Alert, Appropriate and Oriented Extremity - Neurovascular intact Sensation intact distally Intact pulses distally Dorsiflexion/Plantar flexion intact No cellulitis present Compartment soft Dressing - dressing C/D/I and no drainage, Praveena intact without drainage Motor Function - intact, moving foot and toes well on exam.   Past Medical History:  Diagnosis Date  . Anxiety   . Breast cyst   . Cervical cancer ([place]) 03/2016   partial hysterectomy  . Fatigue   . Hypertension     Assessment/Plan:   1 Day Post-Op Procedure(s) (LRB): TOTAL HIP ARTHROPLASTY ANTERIOR  APPROACH, ADD BURSECTOMY (Left) EXCISION/RELEASE BURSA HIP (Left) Active Problems:   Status post total hip replacement, left  Estimated body mass index is 23.49 kg/m as calculated from the following:   Height as of this encounter: 5\' 7"  (1.702 m).   Weight as of this encounter: 68 kg. Advance diet Up with therapy  Needs bowel movement Labs and vital signs are stable. Pain well controlled. Care manager to assist with discharge to home with home health PT   DVT Prophylaxis - Lovenox, TED hose and SCDs Weight-Bearing as tolerated to left leg   T. Rachelle Hora, PA-C [place] 07/26/2019, 6:54 AM

## 2019-07-26 NOTE — Progress Notes (Signed)
Physical Therapy Treatment Patient Details Name: Grace Moreno MRN: [id number] DOB: [birth year] Today's Date: 07/26/2019    History of Present Illness Pt is a 48 yo female diagnosed with OA of the left hip and is s/p elective L THA.  PMH includes HTN and anxiety.    PT Comments    Pt presented with deficits in strength, transfers, mobility, gait, balance, and activity tolerance.  Pt required min A with bed mobility tasks and CGA with transfers.  Once in standing pt participated with LLE marching but then stopped responding to commands and was lowered with total assist back to sitting at the EOB and then to supine.  Pt quickly returned to baseline and reported that she did not recall anything from the event.  Nursing notified with orthostatic BPs then taken in supine and then back in sitting at the EOB.  Supine 99/67 and sitting 77/53 with mild symptoms.  Further attempts at standing deferred.  Pt participated in additional supine therex without issue.  Pt will benefit from HHPT services upon discharge to safely address above deficits for decreased caregiver assistance and eventual return to PLOF.     Follow Up Recommendations  Home health PT     Equipment Recommendations  Rolling walker with 5" wheels;3in1 (PT)    Recommendations for Other Services       Precautions / Restrictions Precautions Precautions: Anterior Hip Precaution Booklet Issued: Yes (comment) Restrictions Weight Bearing Restrictions: Yes LLE Weight Bearing: Weight bearing as tolerated    Mobility  Bed Mobility Overal bed mobility: Needs Assistance Bed Mobility: Supine to Sit;Sit to Supine     Supine to sit: Supervision Sit to supine: Min assist   General bed mobility comments: Min A for LLE control  Transfers Overall transfer level: Needs assistance Equipment used: Rolling walker (2 wheeled) Transfers: Sit to/from Stand Sit to Stand: Min guard         General transfer comment: Good eccentric and  concentric control during sit to/from stand  Ambulation/Gait             General Gait Details: NT secondary to pt with syncopal episode in standing   Stairs             Wheelchair Mobility    Modified Rankin (Stroke Patients Only)       Balance Overall balance assessment: Needs assistance Sitting-balance support: Single extremity supported;Feet supported Sitting balance-Leahy Scale: Good     Standing balance support: Bilateral upper extremity supported Standing balance-Leahy Scale: Fair                              Cognition Arousal/Alertness: Awake/alert Behavior During Therapy: WFL for tasks assessed/performed Overall Cognitive Status: Within Functional Limits for tasks assessed                                        Exercises Total Joint Exercises Ankle Circles/Pumps: Strengthening;AROM;Both;10 reps;15 reps Quad Sets: Strengthening;Both;10 reps;15 reps Gluteal Sets: Strengthening;Both;10 reps;15 reps Hip ABduction/ADduction: AROM;AAROM;Both;10 reps Straight Leg Raises: AAROM;AROM;Both;10 reps Long Arc Quad: AROM;Strengthening;Both;10 reps;15 reps Knee Flexion: AROM;Strengthening;Both;10 reps;15 reps Marching in Standing: AROM;Left;5 reps;Standing Bridges: AROM;Both;5 reps Other Exercises Other Exercises: HEP education and review per handout    General Comments        Pertinent Vitals/Pain Pain Assessment: 0-10 Pain Score: 5  Pain Location: L hip Pain  Descriptors / Indicators: Aching;Sore Pain Intervention(s): Premedicated before session;Monitored during session    Home Living                      Prior Function            PT Goals (current goals can now be found in the care plan section) Progress towards PT goals: Not progressing toward goals - comment(Pt limited by syncope in standing)    Frequency    BID      PT Plan Current plan remains appropriate    Co-evaluation               AM-PAC PT "6 Clicks" Mobility   Outcome Measure  Help needed turning from your back to your side while in a flat bed without using bedrails?: A Little Help needed moving from lying on your back to sitting on the side of a flat bed without using bedrails?: A Little Help needed moving to and from a bed to a chair (including a wheelchair)?: A Little Help needed standing up from a chair using your arms (e.g., wheelchair or bedside chair)?: A Little Help needed to walk in hospital room?: A Little Help needed climbing 3-5 steps with a railing? : A Little 6 Click Score: 18    End of Session Equipment Utilized During Treatment: Gait belt Activity Tolerance: Treatment limited secondary to medical complications (Comment)(Syncope in standing) Patient left: in bed;with call bell/phone within reach;with bed alarm set;with SCD's reapplied;with family/visitor present Nurse Communication: Mobility status;Other (comment)(Pt with syncopal episode in standing; orthostatic BPs taken in supine and sitting and communicated to nsg) PT Visit Diagnosis: Other abnormalities of gait and mobility (R26.89);Muscle weakness (generalized) (M62.81)     Time: [id number] PT Time Calculation (min) (ACUTE ONLY): 39 min  Charges:  $Therapeutic Exercise: 23-37 mins $Therapeutic Activity: 8-22 mins                     D. Scott Verdon Ferrante PT, DPT 07/26/19, 11:43 AM

## 2019-07-27 ENCOUNTER — Inpatient Hospital Stay: Payer: Managed Care, Other (non HMO)

## 2019-07-27 LAB — BASIC METABOLIC PANEL
Anion gap: 7 (ref 5–15)
BUN: 12 mg/dL (ref 6–20)
CO2: 24 mmol/L (ref 22–32)
Calcium: 8.5 mg/dL — ABNORMAL LOW (ref 8.9–10.3)
Chloride: 105 mmol/L (ref 98–111)
Creatinine, Ser: 0.67 mg/dL (ref 0.44–1.00)
GFR calc Af Amer: >60 mL/min (ref >60)
GFR calc non Af Amer: >60 mL/min (ref >60)
Glucose, Bld: 107 mg/dL — ABNORMAL HIGH (ref 70–99)
Potassium: 3.4 mmol/L — ABNORMAL LOW (ref 3.5–5.1)
Sodium: 136 mmol/L (ref 135–145)

## 2019-07-27 LAB — URINALYSIS, COMPLETE (UACMP) WITH MICROSCOPIC
Bacteria, UA: NONE SEEN
Bilirubin Urine: NEGATIVE
Glucose, UA: NEGATIVE mg/dL
Ketones, ur: 80 mg/dL — AB
Leukocytes,Ua: NEGATIVE
Nitrite: NEGATIVE
Protein, ur: 30 mg/dL — AB
Specific Gravity, Urine: 1.021 (ref 1.005–1.030)
pH: 5 (ref 5.0–8.0)

## 2019-07-27 LAB — CBC
HCT: 30.1 % — ABNORMAL LOW (ref 36.0–46.0)
Hemoglobin: 9.9 g/dL — ABNORMAL LOW (ref 12.0–15.0)
MCH: 29.3 pg (ref 26.0–34.0)
MCHC: 32.9 g/dL (ref 30.0–36.0)
MCV: 89.1 fL (ref 80.0–100.0)
Platelets: 175 10*3/uL (ref 150–400)
RBC: 3.38 MIL/uL — ABNORMAL LOW (ref 3.87–5.11)
RDW: 11.3 % — ABNORMAL LOW (ref 11.5–15.5)
WBC: 11.2 10*3/uL — ABNORMAL HIGH (ref 4.0–10.5)
nRBC: 0 % (ref 0.0–0.2)

## 2019-07-27 MED ORDER — TRAMADOL HCL 50 MG PO TABS
50.0000 mg | ORAL_TABLET | Freq: Once | ORAL | Status: AC
  Administered 2019-07-27: 17:00:00 50 mg via ORAL
  Filled 2019-07-27: qty 1

## 2019-07-27 MED ORDER — ENOXAPARIN SODIUM 40 MG/0.4ML ~~LOC~~ SOLN: 40.0000 mg | SUBCUTANEOUS | 0 refills | Status: AC

## 2019-07-27 MED ORDER — CELECOXIB 200 MG PO CAPS
200.0000 mg | ORAL_CAPSULE | Freq: Two times a day (BID) | ORAL | Status: DC
  Administered 2019-07-27 – 2019-07-28 (×3): 200 mg via ORAL
  Filled 2019-07-27 (×3): qty 1

## 2019-07-27 MED ORDER — METHOCARBAMOL 500 MG PO TABS: 500.0000 mg | ORAL_TABLET | Freq: Four times a day (QID) | ORAL | 0 refills | Status: AC | PRN

## 2019-07-27 MED ORDER — SODIUM CHLORIDE 0.9 % IV BOLUS
1000.0000 mL | Freq: Once | INTRAVENOUS | Status: AC
  Administered 2019-07-27: 1000 mL via INTRAVENOUS

## 2019-07-27 MED ORDER — OXYCODONE HCL 5 MG PO TABS: 5.0000 mg | ORAL_TABLET | ORAL | 0 refills | Status: DC | PRN

## 2019-07-27 MED ORDER — TRAMADOL HCL 50 MG PO TABS
50.0000 mg | ORAL_TABLET | Freq: Once | ORAL | Status: AC
  Administered 2019-07-27: 50 mg via ORAL

## 2019-07-27 MED ORDER — TRAMADOL HCL 50 MG PO TABS: 50.0000 mg | ORAL_TABLET | Freq: Four times a day (QID) | ORAL | 0 refills | Status: DC | PRN

## 2019-07-27 MED ORDER — ACETAMINOPHEN 325 MG PO TABS: 500.0000 mg | ORAL_TABLET | Freq: Four times a day (QID) | ORAL | Status: AC | PRN

## 2019-07-27 NOTE — Discharge Summary (Addendum)
Physician Discharge Summary  Patient ID: Grace Moreno MRN: [id number] DOB/AGE: [birth year] 48 y.o.  Admit date: 07/25/2019 Discharge date: 07/28/2019 Admission Diagnoses:  PRIMARY OSTEOARTHRITIS OF LEFT HIP   Discharge Diagnoses: Patient Active Problem List   Diagnosis Date Noted  . Status post total hip replacement, left 07/25/2019  . CIN II (cervical intraepithelial neoplasia II) 04/20/2016  . Breast cyst 02/02/2013    Past Medical History:  Diagnosis Date  . Anxiety   . Breast cyst   . Cervical cancer ([place]) 03/2016   partial hysterectomy  . Fatigue   . Hypertension      Transfusion: none   Consultants (if any):   Discharged Condition: Improved  Hospital Course: LUDORA SAAB is an 48 y.o. female who was admitted 07/25/2019 with a diagnosis of <principal problem not specified> and went to the operating room on 07/25/2019 and underwent the above named procedures.    Surgeries: Procedure(s): TOTAL HIP ARTHROPLASTY ANTERIOR APPROACH, ADD BURSECTOMY EXCISION/RELEASE BURSA HIP on 07/25/2019 Patient tolerated the surgery well. Taken to PACU where she was stabilized and then transferred to the orthopedic floor.  Started on Lovenox 40 mg q 24 hrs. Foot pumps applied bilaterally at 80 mm. Heels elevated on bed with rolled towels. No evidence of DVT. Negative Homan. Physical therapy started on day #1 for gait training and transfer. OT started day #1 for ADL and assisted devices.  Patient's foley was d/c on day #1. Patient's IV and hemovac was d/c on day #2.  On post op day #3 patient was stable and ready for discharge to home with HHPT.  Implants: Medacta AMIS 3 standard with ceramic S 28 mm head, 50 mm Mpact TM cup and liner  She was given perioperative antibiotics:  Anti-infectives (From admission, onward)   Start     Dose/Rate Route Frequency Ordered Stop   07/25/19 1400  ceFAZolin (ANCEF) IVPB 2g/100 mL premix     2 g 200 mL/hr over 30 Minutes Intravenous Every 6 hours  07/25/19 1306 07/26/19 0305   07/25/19 0823  ceFAZolin (ANCEF) 2-4 GM/100ML-% IVPB    Note to Pharmacy: Milinda Cave   : cabinet override      07/25/19 0823 07/25/19 2029   07/25/19 0600  ceFAZolin (ANCEF) IVPB 2g/100 mL premix  Status:  Discontinued     2 g 200 mL/hr over 30 Minutes Intravenous On call to O.R. 07/25/19 0056 07/25/19 1306    .  She was given sequential compression devices, early ambulation, and Lovenox TEDs for DVT prophylaxis.  She benefited maximally from the hospital stay and there were no complications.    Recent vital signs:  Vitals:   07/28/19 0844 07/28/19 1532  BP: 111/65 115/63  Pulse: 72 64  Resp: 16 16  Temp: 98.4 F (36.9 C) 98.2 F (36.8 C)  SpO2: 99% 100%    Recent laboratory studies:  Lab Results  Component Value Date   HGB 9.9 (L) 07/27/2019   HGB 10.7 (L) 07/26/2019   HGB 12.6 07/25/2019   Lab Results  Component Value Date   WBC 11.2 (H) 07/27/2019   PLT 175 07/27/2019   Lab Results  Component Value Date   INR 1.0 07/12/2019   Lab Results  Component Value Date   NA 136 07/27/2019   K 3.4 (L) 07/27/2019   CL 105 07/27/2019   CO2 24 07/27/2019   BUN 12 07/27/2019   CREATININE 0.67 07/27/2019   GLUCOSE 107 (H) 07/27/2019    Discharge Medications:  Allergies as of 07/28/2019      Reactions   Pramipexole Dihydrochloride Nausea And Vomiting, Other (See Comments)   "fainting      Medication List    STOP taking these medications   ibuprofen 600 MG tablet Commonly known as: ADVIL   oxyCODONE-acetaminophen 5-325 MG tablet Commonly known as: PERCOCET/ROXICET     TAKE these medications   acetaminophen 325 MG tablet Commonly known as: TYLENOL Take 1.5-3 tablets (487.5-975 mg total) by mouth every 6 (six) hours as needed for mild pain (pain score 1-3 or temp > 100.5).   ALPRAZolam 0.5 MG tablet Commonly known as: XANAX Take 0.5 mg by mouth daily as needed for anxiety.   docusate sodium 100 MG capsule Commonly known  as: COLACE Take 1 capsule (100 mg total) by mouth 2 (two) times daily.   enoxaparin 40 MG/0.4ML injection Commonly known as: LOVENOX Inject 0.4 mLs (40 mg total) into the skin daily for 14 days.   HYDROcodone-acetaminophen 5-325 MG tablet Commonly known as: NORCO/VICODIN Take 1 tablet by mouth every 4 (four) hours as needed for moderate pain or severe pain.   irbesartan 75 MG tablet Commonly known as: AVAPRO Take 75 mg by mouth daily.   methocarbamol 500 MG tablet Commonly known as: ROBAXIN Take 1 tablet (500 mg total) by mouth every 6 (six) hours as needed for muscle spasms.   Retin-A Micro Pump 0.08 % Gel Generic drug: Tretinoin Microsphere Apply 1 Pump topically at bedtime.   traMADol 50 MG tablet Commonly known as: ULTRAM Take 1-2 tablets (50-100 mg total) by mouth every 4 (four) hours as needed for moderate pain or severe pain (No more than 8 tablets in a 24-hour.).            Durable Medical Equipment  (From admission, onward)         Start     Ordered   07/25/19 1307  DME Walker rolling  Once    Question:  Patient needs a walker to treat with the following condition  Answer:  Status post total hip replacement, left   07/25/19 1306   07/25/19 1307  DME 3 n 1  Once     07/25/19 1306   07/25/19 1307  DME Bedside commode  Once    Question:  Patient needs a bedside commode to treat with the following condition  Answer:  Status post total hip replacement, left   07/25/19 1306          Diagnostic Studies: Dg Chest 2 View  Result Date: 07/27/2019 CLINICAL DATA:  Fever EXAM: CHEST - 2 VIEW COMPARISON:  None. FINDINGS: The heart size and mediastinal contours are within normal limits. Both lungs are clear. The visualized skeletal structures are unremarkable. IMPRESSION: No acute abnormality of the lungs. Electronically Signed   By: Eddie Candle M.D.   On: 07/27/2019 09:42   US Breast Ltd Uni Left Inc Axilla  Result Date: 07/13/2019 CLINICAL DATA:  48 year old  female with palpable mass in the OUTER RIGHT breast discovered on clinical examination and palpable mass within the UPPER OUTER LEFT breast discovered on self-examination. EXAM: DIGITAL DIAGNOSTIC BILATERAL MAMMOGRAM WITH CAD AND TOMO ULTRASOUND BILATERAL BREAST COMPARISON:  Previous exam(s). ACR Breast Density Category c: The breast tissue is heterogeneously dense, which may obscure small masses. FINDINGS: 2D/3D full field views of both breasts and spot compression view of the LEFT breast demonstrate no suspicious mass, distortion or worrisome calcifications. A circumscribed oval mass within the OUTER RIGHT breast and circumscribed  oval mass within the UPPER-OUTER LEFT breast are again noted. Mammographic images were processed with CAD. Targeted ultrasound is performed, showing a 1.4 x 1.1 x 1.4 cm benign cyst at the 9 o'clock position of the RIGHT breast 5 cm from the nipple and a 1.2 x 0.8 x 1.3 cm benign complicated cyst at the 1 o'clock position of the LEFT breast 5 cm from the nipple are noted and correspond to the patient's palpable abnormalities. IMPRESSION: Benign cyst within the OUTER RIGHT breast and benign cyst within the UPPER-OUTER LEFT breast corresponding to the palpable abnormalities. No mammographic evidence of breast malignancy. RECOMMENDATION: Bilateral screening mammogram in 1 year. I have discussed the findings and recommendations with the patient. If applicable, a reminder letter will be sent to the patient regarding the next appointment. BI-RADS CATEGORY  2: Benign. Electronically Signed   By: Margarette Canada M.D.   On: 07/13/2019 16:52   US Breast Ltd Uni Right Inc Axilla  Result Date: 07/13/2019 CLINICAL DATA:  48 year old female with palpable mass in the OUTER RIGHT breast discovered on clinical examination and palpable mass within the UPPER OUTER LEFT breast discovered on self-examination. EXAM: DIGITAL DIAGNOSTIC BILATERAL MAMMOGRAM WITH CAD AND TOMO ULTRASOUND BILATERAL BREAST  COMPARISON:  Previous exam(s). ACR Breast Density Category c: The breast tissue is heterogeneously dense, which may obscure small masses. FINDINGS: 2D/3D full field views of both breasts and spot compression view of the LEFT breast demonstrate no suspicious mass, distortion or worrisome calcifications. A circumscribed oval mass within the OUTER RIGHT breast and circumscribed oval mass within the UPPER-OUTER LEFT breast are again noted. Mammographic images were processed with CAD. Targeted ultrasound is performed, showing a 1.4 x 1.1 x 1.4 cm benign cyst at the 9 o'clock position of the RIGHT breast 5 cm from the nipple and a 1.2 x 0.8 x 1.3 cm benign complicated cyst at the 1 o'clock position of the LEFT breast 5 cm from the nipple are noted and correspond to the patient's palpable abnormalities. IMPRESSION: Benign cyst within the OUTER RIGHT breast and benign cyst within the UPPER-OUTER LEFT breast corresponding to the palpable abnormalities. No mammographic evidence of breast malignancy. RECOMMENDATION: Bilateral screening mammogram in 1 year. I have discussed the findings and recommendations with the patient. If applicable, a reminder letter will be sent to the patient regarding the next appointment. BI-RADS CATEGORY  2: Benign. Electronically Signed   By: Margarette Canada M.D.   On: 07/13/2019 16:52   Mm Diag Breast Tomo Bilateral  Result Date: 07/13/2019 CLINICAL DATA:  48 year old female with palpable mass in the OUTER RIGHT breast discovered on clinical examination and palpable mass within the UPPER OUTER LEFT breast discovered on self-examination. EXAM: DIGITAL DIAGNOSTIC BILATERAL MAMMOGRAM WITH CAD AND TOMO ULTRASOUND BILATERAL BREAST COMPARISON:  Previous exam(s). ACR Breast Density Category c: The breast tissue is heterogeneously dense, which may obscure small masses. FINDINGS: 2D/3D full field views of both breasts and spot compression view of the LEFT breast demonstrate no suspicious mass, distortion  or worrisome calcifications. A circumscribed oval mass within the OUTER RIGHT breast and circumscribed oval mass within the UPPER-OUTER LEFT breast are again noted. Mammographic images were processed with CAD. Targeted ultrasound is performed, showing a 1.4 x 1.1 x 1.4 cm benign cyst at the 9 o'clock position of the RIGHT breast 5 cm from the nipple and a 1.2 x 0.8 x 1.3 cm benign complicated cyst at the 1 o'clock position of the LEFT breast 5 cm from the nipple are  noted and correspond to the patient's palpable abnormalities. IMPRESSION: Benign cyst within the OUTER RIGHT breast and benign cyst within the UPPER-OUTER LEFT breast corresponding to the palpable abnormalities. No mammographic evidence of breast malignancy. RECOMMENDATION: Bilateral screening mammogram in 1 year. I have discussed the findings and recommendations with the patient. If applicable, a reminder letter will be sent to the patient regarding the next appointment. BI-RADS CATEGORY  2: Benign. Electronically Signed   By: Margarette Canada M.D.   On: 07/13/2019 16:52   Dg Hip Operative Unilat W Or W/o Pelvis Left  Result Date: 07/25/2019 CLINICAL DATA:  LEFT anterior hip replacement. EXAM: OPERATIVE LEFT HIP (WITH PELVIS IF PERFORMED) 2 VIEWS TECHNIQUE: Fluoroscopic spot image(s) were submitted for interpretation post-operatively. COMPARISON:  None. FINDINGS: Intraoperative fluoroscopic images demonstrate LEFT hip arthroplasty hardware. Hardware appears intact and appropriately positioned. Fluoroscopy provided for 18 seconds. IMPRESSION: Intraoperative fluoroscopic images demonstrating LEFT hip arthroplasty hardware. Hardware appears intact and appropriately positioned. No evidence of surgical complicating feature. Electronically Signed   By: Franki Cabot M.D.   On: 07/25/2019 11:48   Dg Hip Unilat W Or W/o Pelvis 2-3 Views Left  Result Date: 07/25/2019 CLINICAL DATA:  Left hip replacement. EXAM: DG HIP (WITH OR WITHOUT PELVIS) 2-3V LEFT  COMPARISON:  CT 02/03/2019. FINDINGS: Total left hip replacement. Hardware intact. Anatomic alignment. No acute bony abnormality. IMPRESSION: Total left hip replacement with anatomic alignment. Electronically Signed   By: [place]   On: 07/25/2019 12:20    Disposition:     Follow-up Information    Duanne Guess, PA-C Follow up in 2 week(s).   Specialties: Orthopedic Surgery, Emergency Medicine Contact information: [place] [address] [phone]            Signed: Feliberto Gottron 07/28/2019, 3:39 PM

## 2019-07-27 NOTE — Progress Notes (Signed)
Yellow MEWS Vital Signs initiated as per protocol. PRN tylenol given.

## 2019-07-27 NOTE — Discharge Instructions (Signed)
ANTERIOR APPROACH TOTAL HIP REPLACEMENT POSTOPERATIVE DIRECTIONS   Hip Rehabilitation, Guidelines Following Surgery  The results of a hip operation are greatly improved after range of motion and muscle strengthening exercises. Follow all safety measures which are given to protect your hip. If any of these exercises cause increased pain or swelling in your joint, decrease the amount until you are comfortable again. Then slowly increase the exercises. Call your caregiver if you have problems or questions.   HOME CARE INSTRUCTIONS  Remove items at home which could result in a fall. This includes throw rugs or furniture in walking pathways.   ICE to the affected hip every three hours for 30 minutes at a time and then as needed for pain and swelling.  Continue to use ice on the hip for pain and swelling from surgery. You may notice swelling that will progress down to the foot and ankle.  This is normal after surgery.  Elevate the leg when you are not up walking on it.    Continue to use the breathing machine which will help keep your temperature down.  It is common for your temperature to cycle up and down following surgery, especially at night when you are not up moving around and exerting yourself.  The breathing machine keeps your lungs expanded and your temperature down.  Do not place pillow under knee, focus on keeping the knee straight while resting  DIET You may resume your previous home diet once your are discharged from the hospital.  DRESSING / WOUND CARE / SHOWERING Please remove provena negative pressure dressing on 08/04/2019 and apply honey comb dressing. Keep dressing clean and dry at all times.  ACTIVITY Walk with your walker as instructed. Use walker as long as suggested by your caregivers. Avoid periods of inactivity such as sitting longer than an hour when not asleep. This helps prevent blood clots.  You may resume a sexual relationship in one month or when given the OK by  your doctor.  You may return to work once you are cleared by your doctor.  Do not drive a car for 6 weeks or until released by you surgeon.  Do not drive while taking narcotics.  WEIGHT BEARING Weight bearing as tolerated. Use walker/cane as needed for at least 4 weeks post op.  POSTOPERATIVE CONSTIPATION PROTOCOL Constipation - defined medically as fewer than three stools per week and severe constipation as less than one stool per week.  One of the most common issues patients have following surgery is constipation.  Even if you have a regular bowel pattern at home, your normal regimen is likely to be disrupted due to multiple reasons following surgery.  Combination of anesthesia, postoperative narcotics, change in appetite and fluid intake all can affect your bowels.  In order to avoid complications following surgery, here are some recommendations in order to help you during your recovery period.  Colace (docusate) - Pick up an over-the-counter form of Colace or another stool softener and take twice a day as long as you are requiring postoperative pain medications.  Take with a full glass of water daily.  If you experience loose stools or diarrhea, hold the colace until you stool forms back up.  If your symptoms do not get better within 1 week or if they get worse, check with your doctor.  Dulcolax (bisacodyl) - Pick up over-the-counter and take as directed by the product packaging as needed to assist with the movement of your bowels.  Take with a full  glass of water.  Use this product as needed if not relieved by Colace only.  ° °MiraLax (polyethylene glycol) - Pick up over-the-counter to have on hand.  MiraLax is a solution that will increase the amount of water in your bowels to assist with bowel movements.  Take as directed and can mix with a glass of water, juice, soda, coffee, or tea.  Take if you go more than two days without a movement. °Do not use MiraLax more than once per day. Call your  doctor if you are still constipated or irregular after using this medication for 7 days in a row. ° °If you continue to have problems with postoperative constipation, please contact the office for further assistance and recommendations.  If you experience "the worst abdominal pain ever" or develop nausea or vomiting, please contact the office immediatly for further recommendations for treatment. ° °ITCHING ° If you experience itching with your medications, try taking only a single pain pill, or even half a pain pill at a time.  You can also use Benadryl over the counter for itching or also to help with sleep.  ° °TED HOSE STOCKINGS °Wear the elastic stockings on both legs for six weeks following surgery during the day but you may remove then at night for sleeping. ° °MEDICATIONS °See your medication summary on the “After Visit Summary” that the nursing staff will review with you prior to discharge.  You may have some home medications which will be placed on hold until you complete the course of blood thinner medication.  It is important for you to complete the blood thinner medication as prescribed by your surgeon.  Continue your approved medications as instructed at time of discharge. ° °PRECAUTIONS °If you experience chest pain or shortness of breath - call 911 immediately for transfer to the hospital emergency department.  °If you develop a fever greater that 101 F, purulent drainage from wound, increased redness or drainage from wound, foul odor from the wound/dressing, or calf pain - CONTACT YOUR SURGEON.   °                                                °FOLLOW-UP APPOINTMENTS °Make sure you keep all of your appointments after your operation with your surgeon and caregivers. You should call the office at the above phone number and make an appointment for approximately two weeks after the date of your surgery or on the date instructed by your surgeon outlined in the "After Visit Summary". ° °RANGE OF MOTION  AND STRENGTHENING EXERCISES  °These exercises are designed to help you keep full movement of your hip joint. Follow your caregiver's or physical therapist's instructions. Perform all exercises about fifteen times, three times per day or as directed. Exercise both hips, even if you have had only one joint replacement. These exercises can be done on a training (exercise) mat, on the floor, on a table or on a bed. Use whatever works the best and is most comfortable for you. Use music or television while you are exercising so that the exercises are a pleasant break in your day. This will make your life better with the exercises acting as a break in routine you can look forward to.  °Lying on your back, slowly slide your foot toward your buttocks, raising your knee up off the floor. Then slowly   slide your foot back down until your leg is straight again.  °Lying on your back spread your legs as far apart as you can without causing discomfort.  °Lying on your side, raise your upper leg and foot straight up from the floor as far as is comfortable. Slowly lower the leg and repeat.  °Lying on your back, tighten up the muscle in the front of your thigh (quadriceps muscles). You can do this by keeping your leg straight and trying to raise your heel off the floor. This helps strengthen the largest muscle supporting your knee.  °Lying on your back, tighten up the muscles of your buttocks both with the legs straight and with the knee bent at a comfortable angle while keeping your heel on the floor.  ° °IF YOU ARE TRANSFERRED TO A SKILLED REHAB FACILITY °If the patient is transferred to a skilled rehab facility following release from the hospital, a list of the current medications will be sent to the facility for the patient to continue.  When discharged from the skilled rehab facility, please have the facility set up the patient's Home Health Physical Therapy prior to being released. Also, the skilled facility will be responsible  for providing the patient with their medications at time of release from the facility to include their pain medication, the muscle relaxants, and their blood thinner medication. If the patient is still at the rehab facility at time of the two week follow up appointment, the skilled rehab facility will also need to assist the patient in arranging follow up appointment in our office and any transportation needs. ° °MAKE SURE YOU:  °Understand these instructions.  °Get help right away if you are not doing well or get worse.  ° ° °Pick up stool softner and laxative for home use following surgery while on pain medications. °Continue to use ice for pain and swelling after surgery. °Do not use any lotions or creams on the incision until instructed by your surgeon. ° °

## 2019-07-27 NOTE — Progress Notes (Signed)
Patient requesting something for pain but PA recommended avoiding narcotics and using tramadol. It is not time for tramadol. Dr Rudene Christians notified and ordered a once of tramadol now

## 2019-07-27 NOTE — Progress Notes (Addendum)
   Subjective: 2 Days Post-Op Procedure(s) (LRB): TOTAL HIP ARTHROPLASTY ANTERIOR APPROACH, ADD BURSECTOMY (Left) EXCISION/RELEASE BURSA HIP (Left) Patient reports pain as mild.  Overall doing well with no complaints.  Patient has had high fevers yesterday and this morning.  No Tylenol given today.  She denies any coughing, chest pain, shortness of breath, abdominal pain, nausea or vomiting.  No urinary symptoms. Patient orthostatic yesterday during PT.  Fluids were given.  Tolerating p.o. well.  We will see how she progresses with PT today. Patient is well, and has had no acute complaints or problems We will continue therapy today.  Plan is to go Home after hospital stay.  Objective: Vital signs in last 24 hours: Temp:  [98.9 F (37.2 C)-102.9 F (39.4 C)] 101.8 F (38.8 C) (11/05 0809) Pulse Rate:  [70-97] 71 (11/05 0809) Resp:  [17-18] 17 (11/05 0809) BP: (100-114)/(66-70) 101/68 (11/05 0809) SpO2:  [95 %-98 %] 97 % (11/05 0809)  Intake/Output from previous day: 11/04 0701 - 11/05 0700 In: 360 [P.O.:360] Out: 200 [Urine:200] Intake/Output this shift: No intake/output data recorded.  Recent Labs    07/25/19 1345 07/26/19 0502  HGB 12.6 10.7*   Recent Labs    07/25/19 1345 07/26/19 0502  WBC 7.4 9.2  RBC 4.28 3.63*  HCT 38.5 32.5*  PLT 218 211   Recent Labs    07/25/19 1345 07/26/19 0502  NA  --  135  K  --  4.0  CL  --  101  CO2  --  25  BUN  --  15  CREATININE 0.75 0.79  GLUCOSE  --  111*  CALCIUM  --  8.5*   No results for input(s): LABPT, INR in the last 72 hours.  EXAM General - Patient is Alert, Appropriate and Oriented Extremity - Neurovascular intact Sensation intact distally Intact pulses distally Dorsiflexion/Plantar flexion intact No cellulitis present Compartment soft Dressing - dressing C/D/I and no drainage, Praveena intact without drainage Motor Function - intact, moving foot and toes well on exam.   Past Medical History:   Diagnosis Date  . Anxiety   . Breast cyst   . Cervical cancer ([place]) 03/2016   partial hysterectomy  . Fatigue   . Hypertension     Assessment/Plan:   2 Days Post-Op Procedure(s) (LRB): TOTAL HIP ARTHROPLASTY ANTERIOR APPROACH, ADD BURSECTOMY (Left) EXCISION/RELEASE BURSA HIP (Left) Active Problems:   Status post total hip replacement, left   Postop fever   Orthostatic hypotension Estimated body mass index is 23.49 kg/m as calculated from the following:   Height as of this encounter: 5\' 7"  (1.702 m).   Weight as of this encounter: 68 kg. Advance diet Up with therapy  Needs bowel movement Postop fever -we will recheck CBC and BMP.  Chest x-ray and urinalysis ordered.  Make sure patient gets Tylenol every 6. Orthostatic hypotension -give 1 L of IV fluids.  Try to avoid oxycodone.  We will continue tramadol, has tolerated this in the past.  We will start Celebrex 200 mg twice daily.  Encourage p.o. fluids. Care management to assist with discharge. Pain well controlled.   DVT Prophylaxis - Lovenox, TED hose and SCDs Weight-Bearing as tolerated to left leg   T. Rachelle Hora, PA-C [place] 07/27/2019, 8:59 AM

## 2019-07-27 NOTE — Progress Notes (Signed)
Physical Therapy Treatment Patient Details Name: Grace Moreno MRN: [id number] DOB: November 02, 1970 Today's Date: 07/27/2019    History of Present Illness Pt is a 48 yo female diagnosed with OA of the left hip and is s/p elective L THA.  PMH includes HTN and anxiety.    PT Comments    Pt presented with deficits in strength, transfers, mobility, gait, balance, and activity tolerance but full assessment of pt's functional abilitites continued to be limited by orthostatic hypotension.  This sessions BP and HR readings include: supine 113/74, 72 bpm; sitting 104/70, HR 80 bpm; standing 59/50, HR 84 bpm.  Symptoms in standing included pt feeling hot/clammy, dizzy, and nauseous.  Symptoms other than pt feeling hot/clammy resolved quickly upon returning pt to supine, nursing notified.  Pt will benefit from HHPT services upon discharge to safely address above deficits for decreased caregiver assistance and eventual return to PLOF.      Follow Up Recommendations  Home health PT;Supervision for mobility/OOB     Equipment Recommendations  Rolling walker with 5" wheels;3in1 (PT)    Recommendations for Other Services       Precautions / Restrictions Precautions Precautions: Anterior Hip Precaution Booklet Issued: Yes (comment) Restrictions Weight Bearing Restrictions: Yes LLE Weight Bearing: Weight bearing as tolerated Other Position/Activity Restrictions: Pt has been orthostatic in sitting/standing with syncopal and near syncopal episodes    Mobility  Bed Mobility Overal bed mobility: Needs Assistance       Supine to sit: Modified independent (Device/Increase time) Sit to supine: Min guard   General bed mobility comments: Min A for LLE control during sit to sup  Transfers Overall transfer level: Needs assistance Equipment used: Rolling walker (2 wheeled) Transfers: Sit to/from Stand Sit to Stand: Min guard         General transfer comment: Good eccentric and concentric control  during sit to/from stand  Ambulation/Gait             General Gait Details: NT secondary to pt with near syncopal episode in standing   Stairs             Wheelchair Mobility    Modified Rankin (Stroke Patients Only)       Balance Overall balance assessment: Needs assistance Sitting-balance support: Single extremity supported;Feet supported Sitting balance-Leahy Scale: Good     Standing balance support: Single extremity supported Standing balance-Leahy Scale: Fair                              Cognition Arousal/Alertness: Awake/alert Behavior During Therapy: WFL for tasks assessed/performed Overall Cognitive Status: Within Functional Limits for tasks assessed                                        Exercises Total Joint Exercises Ankle Circles/Pumps: Strengthening;AROM;Both;10 reps;15 reps Quad Sets: Strengthening;Both;10 reps;15 reps Gluteal Sets: Strengthening;Both;10 reps;15 reps Towel Squeeze: Strengthening;Both;10 reps Short Arc Quad: AROM;Left;10 reps Hip ABduction/ADduction: AROM;AAROM;Both;10 reps Straight Leg Raises: AAROM;AROM;Both;10 reps Long Arc Quad: AROM;Strengthening;Both;10 reps;15 reps Knee Flexion: AROM;Strengthening;Both;10 reps;15 reps    General Comments        Pertinent Vitals/Pain Pain Assessment: 0-10 Pain Score: 5  Pain Location: L hip Pain Descriptors / Indicators: Aching;Sore Pain Intervention(s): Premedicated before session;Monitored during session    Home Living  Prior Function            PT Goals (current goals can now be found in the care plan section) Progress towards PT goals: Not progressing toward goals - comment(Pt remains limited by orthostatic hypotension)    Frequency    BID      PT Plan Current plan remains appropriate    Co-evaluation              AM-PAC PT "6 Clicks" Mobility   Outcome Measure  Help needed turning from your  back to your side while in a flat bed without using bedrails?: A Little Help needed moving from lying on your back to sitting on the side of a flat bed without using bedrails?: A Little Help needed moving to and from a bed to a chair (including a wheelchair)?: A Little Help needed standing up from a chair using your arms (e.g., wheelchair or bedside chair)?: A Little Help needed to walk in hospital room?: A Lot Help needed climbing 3-5 steps with a railing? : A Lot 6 Click Score: 16    End of Session Equipment Utilized During Treatment: Gait belt Activity Tolerance: Treatment limited secondary to medical complications (Comment)(Orthostatic hypotension) Patient left: in bed;with call bell/phone within reach;with bed alarm set;with SCD's reapplied;with family/visitor present Nurse Communication: Mobility status;Other (comment)(Orthostatic symptoms and BP readings) PT Visit Diagnosis: Other abnormalities of gait and mobility (R26.89);Muscle weakness (generalized) (M62.81)     Time: [id number] PT Time Calculation (min) (ACUTE ONLY): 23 min  Charges:  $Therapeutic Exercise: 8-22 mins $Therapeutic Activity: 8-22 mins                     D. Scott Jonique Kulig PT, DPT 07/27/19, 12:14 PM

## 2019-07-27 NOTE — Progress Notes (Signed)
Physical Therapy Treatment Patient Details Name: Grace Moreno MRN: [id number] DOB: 1971/02/13 Today's Date: 07/27/2019    History of Present Illness Pt is a 48 yo female diagnosed with OA of the left hip and is s/p elective L THA.  PMH includes HTN and anxiety.    PT Comments    Pt presented with deficits in strength, transfers, mobility, gait, balance, and activity tolerance.  Pt's orthostatic BPs and HRs this session: supine: 111/65, HR 64; sitting 115/72, HR 68; standing 109/73, HR 75 with no symptoms in standing.  Pt able to amb 10' in room before symptoms began to appear including feeling clammy and mildly dizzy.  Pt returned to sitting with sitting BP 95/65, nursing updated.  Pt ambulated with slow, effortful, antalgic step-to pattern with heavy lean on the RW during LLE stance phase.  Discharge recommendation updated to SNF secondary to pt's slow progression.  Pt will benefit from PT services in a SNF setting upon discharge to safely address above deficits for decreased caregiver assistance and eventual return to PLOF.      Follow Up Recommendations  SNF     Equipment Recommendations  Rolling walker with 5" wheels;3in1 (PT)    Recommendations for Other Services       Precautions / Restrictions Precautions Precautions: Anterior Hip Precaution Booklet Issued: Yes (comment) Restrictions Weight Bearing Restrictions: Yes LLE Weight Bearing: Weight bearing as tolerated    Mobility  Bed Mobility Overal bed mobility: Needs Assistance       Supine to sit: Supervision Sit to supine: Supervision   General bed mobility comments: Extra time and effort and use of BUEs or RLE to assist LLE in/out of bed  Transfers Overall transfer level: Needs assistance Equipment used: Rolling walker (2 wheeled) Transfers: Sit to/from Stand Sit to Stand: Min guard         General transfer comment: Good eccentric and concentric control during sit to/from  stand  Ambulation/Gait Ambulation/Gait assistance: Min guard Gait Distance (Feet): 10 Feet Assistive device: Rolling walker (2 wheeled) Gait Pattern/deviations: Step-to pattern;Antalgic;Decreased stance time - left;Decreased step length - right Gait velocity: decreased   General Gait Details: Very slow, antalgic step-to gaiit pattern with orthostatic symptoms starting after amb 10'; pt returned to sitting with BP taken at 95/65 compared to pre-gait sitting BP of 115/72.   Stairs             Wheelchair Mobility    Modified Rankin (Stroke Patients Only)       Balance Overall balance assessment: Needs assistance Sitting-balance support: Single extremity supported;Feet supported Sitting balance-Leahy Scale: Good     Standing balance support: Bilateral upper extremity supported Standing balance-Leahy Scale: Fair                              Cognition Arousal/Alertness: Awake/alert Behavior During Therapy: WFL for tasks assessed/performed Overall Cognitive Status: Within Functional Limits for tasks assessed                                        Exercises Total Joint Exercises Ankle Circles/Pumps: Strengthening;AROM;Both;10 reps;15 reps Quad Sets: Strengthening;Both;10 reps;15 reps Gluteal Sets: Strengthening;Both;10 reps;15 reps Towel Squeeze: Strengthening;Both;10 reps Hip ABduction/ADduction: AROM;AAROM;Both;10 reps Straight Leg Raises: AAROM;AROM;Both;10 reps Long Arc Quad: AROM;Strengthening;Both;10 reps;15 reps Knee Flexion: AROM;Strengthening;Both;10 reps;15 reps Marching in Standing: AROM;Left;Standing;Both;10 reps Bridges: AROM;Both;5 reps Other Exercises  Other Exercises: Anterior hip precaution education and review during sup to sit to the surgical side    General Comments        Pertinent Vitals/Pain Pain Assessment: 0-10 Pain Score: 4  Pain Location: L hip Pain Descriptors / Indicators: Aching;Sore Pain  Intervention(s): Premedicated before session;Monitored during session    Home Living                      Prior Function            PT Goals (current goals can now be found in the care plan section) Progress towards PT goals: Progressing toward goals    Frequency    BID      PT Plan Discharge plan needs to be updated    Co-evaluation              AM-PAC PT "6 Clicks" Mobility   Outcome Measure  Help needed turning from your back to your side while in a flat bed without using bedrails?: A Little Help needed moving from lying on your back to sitting on the side of a flat bed without using bedrails?: A Little Help needed moving to and from a bed to a chair (including a wheelchair)?: A Little Help needed standing up from a chair using your arms (e.g., wheelchair or bedside chair)?: A Little Help needed to walk in hospital room?: A Little Help needed climbing 3-5 steps with a railing? : A Lot 6 Click Score: 17    End of Session Equipment Utilized During Treatment: Gait belt Activity Tolerance: Treatment limited secondary to medical complications (Comment)(Orthostatic hypotension symptoms with limited amb) Patient left: in chair;with call bell/phone within reach;with chair alarm set;with SCD's reapplied;with family/visitor present Nurse Communication: Mobility status;Other (comment)(Orthostatic hypotension update during amb) PT Visit Diagnosis: Other abnormalities of gait and mobility (R26.89);Muscle weakness (generalized) (M62.81)     Time: [id number] PT Time Calculation (min) (ACUTE ONLY): 41 min  Charges:  $Gait Training: 8-22 mins $Therapeutic Exercise: 8-22 mins $Therapeutic Activity: 8-22 mins                     D. Scott Kaedon Fanelli PT, DPT 07/27/19, 4:33 PM

## 2019-07-28 MED ORDER — TRAMADOL HCL 50 MG PO TABS
50.0000 mg | ORAL_TABLET | ORAL | Status: DC | PRN
  Administered 2019-07-28: 100 mg via ORAL
  Filled 2019-07-28: qty 2

## 2019-07-28 MED ORDER — SODIUM CHLORIDE 0.9 % IV BOLUS
1000.0000 mL | Freq: Once | INTRAVENOUS | Status: AC
  Administered 2019-07-28: 13:00:00 1000 mL via INTRAVENOUS

## 2019-07-28 MED ORDER — TRAMADOL HCL 50 MG PO TABS: 50.0000 mg | ORAL_TABLET | ORAL | 0 refills | Status: AC | PRN

## 2019-07-28 MED ORDER — HYDROCODONE-ACETAMINOPHEN 5-325 MG PO TABS
1.0000 | ORAL_TABLET | ORAL | Status: DC | PRN
  Administered 2019-07-28: 13:00:00 1 via ORAL
  Filled 2019-07-28: qty 1

## 2019-07-28 MED ORDER — HYDROCODONE-ACETAMINOPHEN 5-325 MG PO TABS: 1.0000 | ORAL_TABLET | ORAL | 0 refills | Status: AC | PRN

## 2019-07-28 NOTE — Progress Notes (Signed)
Paatient discharged from unit with all personal belongings with her.  Discharge instructions, meds, and follow up appts reviewed with patient.    No distress at this time. Family to provided transportation home.  Wound vac changed out to [place] .

## 2019-07-28 NOTE — Progress Notes (Signed)
Physical Therapy Treatment Patient Details Name: Grace Moreno MRN: [id number] DOB: Aug 04, 1971 Today's Date: 07/28/2019    History of Present Illness Pt is a 48 yo female diagnosed with OA of the left hip and is s/p elective L THA.  PMH includes HTN and anxiety.    PT Comments    Pt presented with deficits in strength, transfers, mobility, gait, balance, and activity tolerance.  Pt ambulated with very slow, antalgic step-to gait pattern with orthostatic symptoms starting after amb 15' including minor dizziness and feeling clammy; pt returned to sitting with BP taken at 88/55 compared to pre-gait sitting BP of 115/74, nursing and PA notified.  Pt will benefit from PT services in a SNF setting upon discharge to safely address above deficits for decreased caregiver assistance and eventual return to PLOF.     Follow Up Recommendations  SNF     Equipment Recommendations  Rolling walker with 5" wheels;3in1 (PT)    Recommendations for Other Services       Precautions / Restrictions Precautions Precautions: Anterior Hip Precaution Booklet Issued: Yes (comment) Restrictions Weight Bearing Restrictions: Yes LLE Weight Bearing: Weight bearing as tolerated Other Position/Activity Restrictions: Pt has been orthostatic in sitting/standing with syncopal and near syncopal episodes    Mobility  Bed Mobility               General bed mobility comments: NT, pt in recliner  Transfers Overall transfer level: Needs assistance Equipment used: Rolling walker (2 wheeled) Transfers: Sit to/from Stand Sit to Stand: Min guard         General transfer comment: Good eccentric and concentric control during sit to/from stand  Ambulation/Gait Ambulation/Gait assistance: Min guard Gait Distance (Feet): 15 Feet Assistive device: Rolling walker (2 wheeled) Gait Pattern/deviations: Step-to pattern;Antalgic;Decreased stance time - left;Decreased step length - right Gait velocity: decreased    General Gait Details: Very slow, antalgic step-to gait pattern with orthostatic symptoms starting after amb 15'; pt returned to sitting with BP taken at 88/55 compared to pre-gait sitting BP of 115/74.   Stairs             Wheelchair Mobility    Modified Rankin (Stroke Patients Only)       Balance Overall balance assessment: Needs assistance Sitting-balance support: Single extremity supported;Feet supported Sitting balance-Leahy Scale: Good     Standing balance support: Bilateral upper extremity supported Standing balance-Leahy Scale: Fair                              Cognition Arousal/Alertness: Awake/alert Behavior During Therapy: WFL for tasks assessed/performed Overall Cognitive Status: Within Functional Limits for tasks assessed                                        Exercises Total Joint Exercises Ankle Circles/Pumps: Strengthening;AROM;Both;10 reps;15 reps Quad Sets: Strengthening;Both;10 reps;15 reps Gluteal Sets: Strengthening;Both;10 reps;15 reps Towel Squeeze: Strengthening;Both;10 reps Long Arc Quad: AROM;Strengthening;Both;10 reps;15 reps Knee Flexion: AROM;Strengthening;Both;10 reps;15 reps    General Comments        Pertinent Vitals/Pain Pain Assessment: 0-10 Pain Score: 6  Pain Location: L hip Pain Descriptors / Indicators: Aching;Sore Pain Intervention(s): Premedicated before session;Monitored during session    Home Living                      Prior Function  PT Goals (current goals can now be found in the care plan section) Progress towards PT goals: Progressing toward goals    Frequency    BID      PT Plan Discharge plan needs to be updated    Co-evaluation              AM-PAC PT "6 Clicks" Mobility   Outcome Measure  Help needed turning from your back to your side while in a flat bed without using bedrails?: A Little Help needed moving from lying on your back to  sitting on the side of a flat bed without using bedrails?: A Little Help needed moving to and from a bed to a chair (including a wheelchair)?: A Little Help needed standing up from a chair using your arms (e.g., wheelchair or bedside chair)?: A Little Help needed to walk in hospital room?: A Little Help needed climbing 3-5 steps with a railing? : A Lot 6 Click Score: 17    End of Session Equipment Utilized During Treatment: Gait belt Activity Tolerance: Treatment limited secondary to medical complications (Comment) Patient left: in chair;with call bell/phone within reach;with chair alarm set;with SCD's reapplied;with family/visitor present Nurse Communication: Mobility status(Orthostatic hypotension with amb) PT Visit Diagnosis: Other abnormalities of gait and mobility (R26.89);Muscle weakness (generalized) (M62.81)     Time: [id number] PT Time Calculation (min) (ACUTE ONLY): 25 min  Charges:  $Therapeutic Exercise: 8-22 mins $Therapeutic Activity: 8-22 mins                     D. Scott Amarya Kuehl PT, DPT 07/28/19, 1:52 PM

## 2019-07-28 NOTE — Progress Notes (Signed)
Physical Therapy Treatment Patient Details Name: Grace Moreno MRN: [id number] DOB: 16-Oct-1970 Today's Date: 07/28/2019    History of Present Illness Pt is a 48 yo female diagnosed with OA of the left hip and is s/p elective L THA.  PMH includes HTN and anxiety.    PT Comments    Pt presented with deficits in strength, transfers, mobility, gait, balance, and activity tolerance but made excellent progress towards goals this session.  Pt's vitals as follows: sitting 115/68, HR 66; standing 105/72, HR 68 without symptoms, sitting after amb 125' 122/62, HR 70 without symptoms.  Pt steady during amb with improving, step-through gait pattern and demonstrated good eccentric and concentric control during stair training.  Pt will benefit from HHPT services upon discharge to safely address above deficits for decreased caregiver assistance and eventual return to PLOF.     Follow Up Recommendations  Home health PT;Supervision for mobility/OOB     Equipment Recommendations  Rolling walker with 5" wheels;3in1 (PT)    Recommendations for Other Services       Precautions / Restrictions Precautions Precautions: Anterior Hip Precaution Booklet Issued: Yes (comment) Restrictions Weight Bearing Restrictions: Yes LLE Weight Bearing: Weight bearing as tolerated Other Position/Activity Restrictions: Pt has been orthostatic in sitting/standing with syncopal and near syncopal episodes    Mobility  Bed Mobility               General bed mobility comments: NT, pt in recliner  Transfers Overall transfer level: Needs assistance Equipment used: Rolling walker (2 wheeled) Transfers: Sit to/from Stand Sit to Stand: Supervision         General transfer comment: Good eccentric and concentric control during sit to/from stand  Ambulation/Gait Ambulation/Gait assistance: Min guard Gait Distance (Feet): 125 Feet Assistive device: Rolling walker (2 wheeled) Gait Pattern/deviations: Step-through  pattern;Step-to pattern;Decreased step length - right;Decreased step length - left Gait velocity: decreased   General Gait Details: Gait pattern improved from step-to initially to step-through with increased cadence and decreased L hip pain with WB.   Stairs Stairs: Yes Stairs assistance: Min guard;+2 safety/equipment Stair Management: Backwards;Forwards;With walker Number of Stairs: 2 General stair comments: Ascend/descend 2 steps x 2, backwards ascending and forward descending with a RW with spouse present for and participating in training   Wheelchair Mobility    Modified Rankin (Stroke Patients Only)       Balance Overall balance assessment: Needs assistance Sitting-balance support: Single extremity supported;Feet supported Sitting balance-Leahy Scale: Good     Standing balance support: Bilateral upper extremity supported Standing balance-Leahy Scale: Good Standing balance comment: Only min WB on RW with UEs                            Cognition Arousal/Alertness: Awake/alert Behavior During Therapy: WFL for tasks assessed/performed Overall Cognitive Status: Within Functional Limits for tasks assessed                                        Exercises Total Joint Exercises Ankle Circles/Pumps: Strengthening;AROM;Both;10 reps;15 reps Quad Sets: Strengthening;Both;10 reps;15 reps Gluteal Sets: Strengthening;Both;10 reps;15 reps Towel Squeeze: Strengthening;Both;10 reps;15 reps Long Arc Quad: AROM;Strengthening;Both;10 reps;15 reps Knee Flexion: AROM;Strengthening;Both;10 reps;15 reps Marching in Standing: AROM;Standing;Both;5 reps Other Exercises Other Exercises: Simulated car transfer training with pt and spouse using hospital transport chair to simulate car Other Exercises: Stair training with verbal  and visual demonstration prior to practicing with both pt and spouse    General Comments        Pertinent Vitals/Pain Pain Assessment:  0-10 Pain Score: 4  Pain Location: L hip Pain Descriptors / Indicators: Aching;Sore Pain Intervention(s): Premedicated before session;Monitored during session    Home Living                      Prior Function            PT Goals (current goals can now be found in the care plan section) Progress towards PT goals: Progressing toward goals    Frequency    BID      PT Plan Discharge plan needs to be updated    Co-evaluation              AM-PAC PT "6 Clicks" Mobility   Outcome Measure  Help needed turning from your back to your side while in a flat bed without using bedrails?: A Little Help needed moving from lying on your back to sitting on the side of a flat bed without using bedrails?: A Little Help needed moving to and from a bed to a chair (including a wheelchair)?: A Little Help needed standing up from a chair using your arms (e.g., wheelchair or bedside chair)?: A Little Help needed to walk in hospital room?: A Little Help needed climbing 3-5 steps with a railing? : A Little 6 Click Score: 18    End of Session Equipment Utilized During Treatment: Gait belt Activity Tolerance: Patient tolerated treatment well Patient left: in chair;with call bell/phone within reach;with chair alarm set;with SCD's reapplied;with family/visitor present Nurse Communication: Mobility status PT Visit Diagnosis: Other abnormalities of gait and mobility (R26.89);Muscle weakness (generalized) (M62.81)     Time: [id number] PT Time Calculation (min) (ACUTE ONLY): 54 min  Charges:  $Gait Training: 23-37 mins $Therapeutic Exercise: 8-22 mins $Therapeutic Activity: 8-22 mins                     D. Scott Arzell Mcgeehan PT, DPT 07/28/19, 3:09 PM

## 2019-07-28 NOTE — Progress Notes (Signed)
   Subjective: 3 Days Post-Op Procedure(s) (LRB): TOTAL HIP ARTHROPLASTY ANTERIOR APPROACH, ADD BURSECTOMY (Left) EXCISION/RELEASE BURSA HIP (Left) Patient reports pain as mild.  Overall doing well with no complaints.  Afebrile today.  Blood pressure improved yesterday afternoon with PT but still orthostatic.  Patient is well, and has had no acute complaints or problems.  No abdominal pain nausea vomiting.  No chest pain or shortness of breath. We will continue therapy today.  Plan is to go Home after hospital stay.  Objective: Vital signs in last 24 hours: Temp:  [98 F (36.7 C)-98.8 F (37.1 C)] 98.4 F (36.9 C) (11/06 0412) Pulse Rate:  [58-78] 68 (11/06 0412) Resp:  [16-18] 16 (11/06 0412) BP: (106-119)/(63-80) 107/76 (11/06 0412) SpO2:  [97 %-100 %] 99 % (11/06 0412)  Intake/Output from previous day: 11/05 0701 - 11/06 0700 In: 1009.3 [IV Piggyback:1009.3] Out: 1225 [Urine:1225] Intake/Output this shift: No intake/output data recorded.  Recent Labs    07/25/19 1345 07/26/19 0502 07/27/19 1108  HGB 12.6 10.7* 9.9*   Recent Labs    07/26/19 0502 07/27/19 1108  WBC 9.2 11.2*  RBC 3.63* 3.38*  HCT 32.5* 30.1*  PLT 211 175   Recent Labs    07/26/19 0502 07/27/19 1109  NA 135 136  K 4.0 3.4*  CL 101 105  CO2 25 24  BUN 15 12  CREATININE 0.79 0.67  GLUCOSE 111* 107*  CALCIUM 8.5* 8.5*   No results for input(s): LABPT, INR in the last 72 hours.  EXAM General - Patient is Alert, Appropriate and Oriented Extremity - Neurovascular intact Sensation intact distally Intact pulses distally Dorsiflexion/Plantar flexion intact No cellulitis present Compartment soft Dressing - dressing C/D/I and no drainage, Praveena intact without drainage Motor Function - intact, moving foot and toes well on exam.   Past Medical History:  Diagnosis Date  . Anxiety   . Breast cyst   . Cervical cancer ([place]) 03/2016   partial hysterectomy  . Fatigue   . Hypertension      Assessment/Plan:   3 Days Post-Op Procedure(s) (LRB): TOTAL HIP ARTHROPLASTY ANTERIOR APPROACH, ADD BURSECTOMY (Left) EXCISION/RELEASE BURSA HIP (Left) Active Problems:   Status post total hip replacement, left   Postop fever   Orthostatic hypotension Estimated body mass index is 23.49 kg/m as calculated from the following:   Height as of this encounter: 5\' 7"  (1.702 m).   Weight as of this encounter: 68 kg. Advance diet Up with therapy  Needs bowel movement Postop fever -resolved.  Continue to monitor.  Chest x-ray urinalysis negative Orthostatic hypotension -vital seem to have improved with sitting and ambulation.  Pain seems to be limiting factor.  Will increase tramadol to 2 tablets every 6 hours. Care management to assist with discharge. Pain well controlled.   DVT Prophylaxis - Lovenox, TED hose and SCDs Weight-Bearing as tolerated to left leg   T. Rachelle Hora, PA-C [place] 07/28/2019, 8:15 AM

## 2019-07-28 NOTE — TOC Transition Note (Signed)
Transition of Care [facility]) - CM/SW Discharge Note   Patient Details  Name: HAVILAH KNIPPEL MRN: [id number] Date of Birth: September 06, 1971  Transition of Care [facility]) CM/SW Contact:  Su Hilt, RN Phone Number: 07/28/2019, 4:20 PM   Clinical Narrative:     Patient doing much better today and is discharging home with her husband, She is set up with [facility] and has a RW and BSC in the room, Lovenox is $5 and she is aware, her husband is to provide transportation  Final next level of care: Home w Home Health Services Barriers to Discharge: Barriers Resolved   Patient Goals and CMS Choice Patient states their goals for this hospitalization and ongoing recovery are:: go home CMS Medicare.gov Compare Post Acute Care list provided to:: Patient Choice offered to / list presented to : Patient  Discharge Placement                       Discharge Plan and Services   Discharge Planning Services: CM Consult Post Acute Care Choice: Home Health          DME Arranged: 3-N-1, [place] rolling DME Agency: [employer] Date DME Agency Contacted: 07/26/19 Time DME Agency Contacted: [phone] Representative spoke with at DME Agency: [place]: PT [place]: [facility] (formerly [employer]) Date [place]: 07/26/19 Time [place]: 1210 Representative spoke with at [place]: [place] ([place]) Interventions     Readmission Risk Interventions No flowsheet data found.

## 2019-10-31 ENCOUNTER — Other Ambulatory Visit: Payer: Self-pay | Admitting: Internal Medicine

## 2019-10-31 DIAGNOSIS — R7989 Other specified abnormal findings of blood chemistry: Secondary | ICD-10-CM

## 2019-11-01 ENCOUNTER — Ambulatory Visit: Payer: Managed Care, Other (non HMO)

## 2019-11-02 ENCOUNTER — Other Ambulatory Visit: Payer: Self-pay

## 2019-11-02 ENCOUNTER — Ambulatory Visit
Admission: RE | Admit: 2019-11-02 | Discharge: 2019-11-02 | Disposition: A | Discharge status: Home / Self Care | Payer: Managed Care, Other (non HMO) | Source: Ambulatory Visit | Attending: Internal Medicine | Admitting: Internal Medicine

## 2019-11-02 DIAGNOSIS — R7989 Other specified abnormal findings of blood chemistry: Secondary | ICD-10-CM | POA: Diagnosis present

## 2019-11-02 MED ORDER — IOHEXOL 350 MG/ML SOLN
75.0000 mL | Freq: Once | INTRAVENOUS | Status: AC | PRN
  Administered 2019-11-02: 08:00:00 75 mL via INTRAVENOUS

## 2020-06-26 ENCOUNTER — Other Ambulatory Visit: Payer: Self-pay | Admitting: Obstetrics and Gynecology

## 2020-06-26 DIAGNOSIS — Z1231 Encounter for screening mammogram for malignant neoplasm of breast: Secondary | ICD-10-CM

## 2020-07-25 ENCOUNTER — Other Ambulatory Visit: Payer: Self-pay

## 2020-07-25 ENCOUNTER — Ambulatory Visit
Admission: RE | Admit: 2020-07-25 | Discharge: 2020-07-25 | Disposition: A | Discharge status: Home / Self Care | Payer: Managed Care, Other (non HMO) | Source: Ambulatory Visit | Attending: Obstetrics and Gynecology | Admitting: Obstetrics and Gynecology

## 2020-07-25 DIAGNOSIS — Z1231 Encounter for screening mammogram for malignant neoplasm of breast: Secondary | ICD-10-CM | POA: Diagnosis present

## 2020-11-25 IMAGING — MG DIGITAL DIAGNOSTIC BILAT W/ TOMO W/ CAD
6 of 12 series · 6 of 36 positions shown · non-contrast
Comparison: Previous exam(s).

CLINICAL DATA: 48-year-old female with palpable mass in the OUTER
RIGHT breast discovered on clinical examination and palpable mass
within the UPPER OUTER LEFT breast discovered on self-examination.

EXAM:
DIGITAL DIAGNOSTIC BILATERAL MAMMOGRAM WITH CAD AND TOMO
ULTRASOUND BILATERAL BREAST

[L TAN synth-2D (1 of 2)]
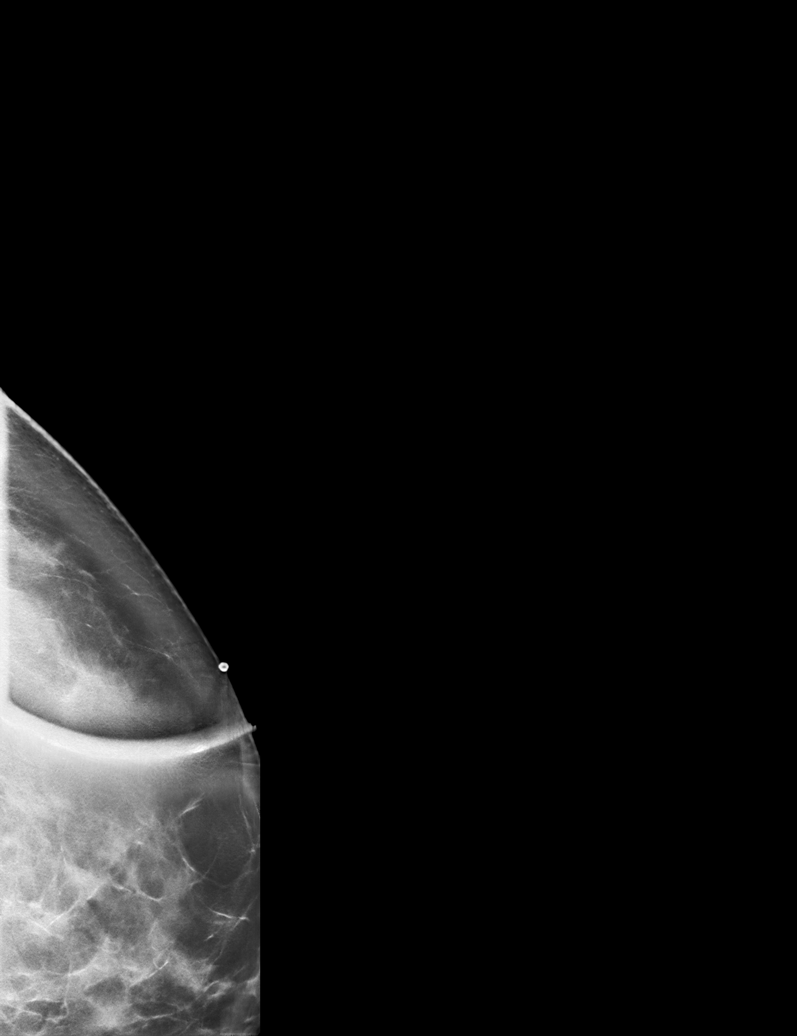

[L CC synth-2D]
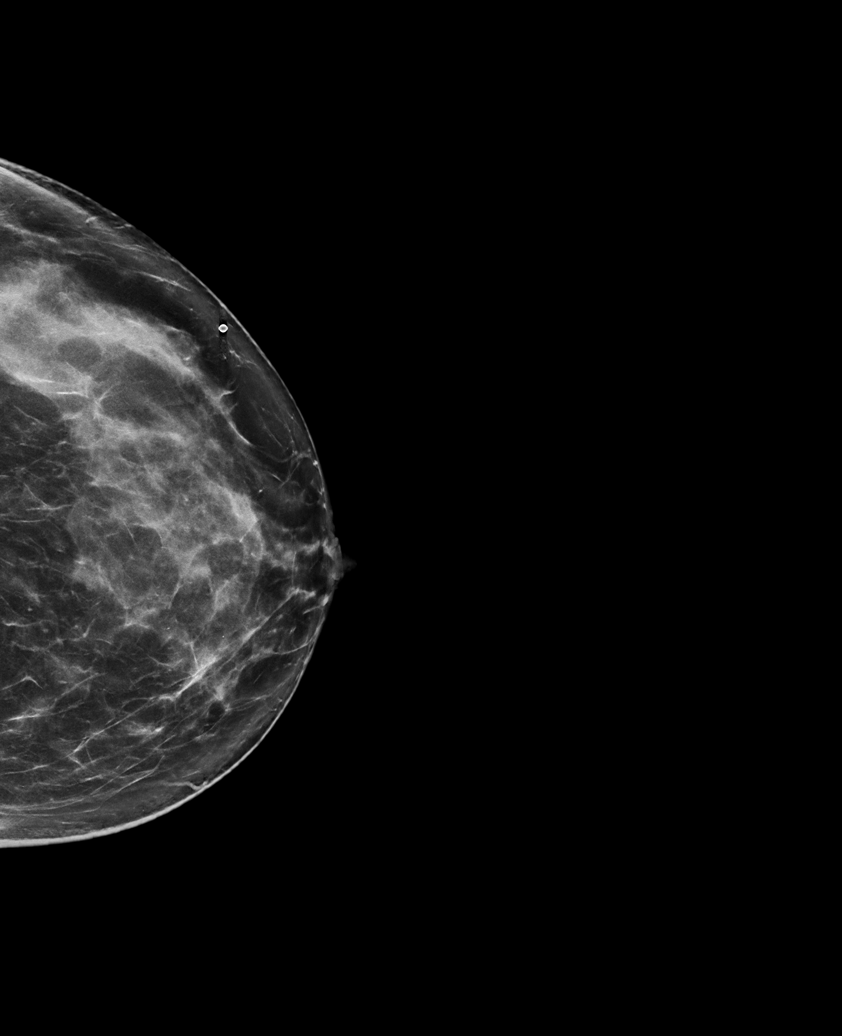

[L TAN synth-2D (2 of 2)]
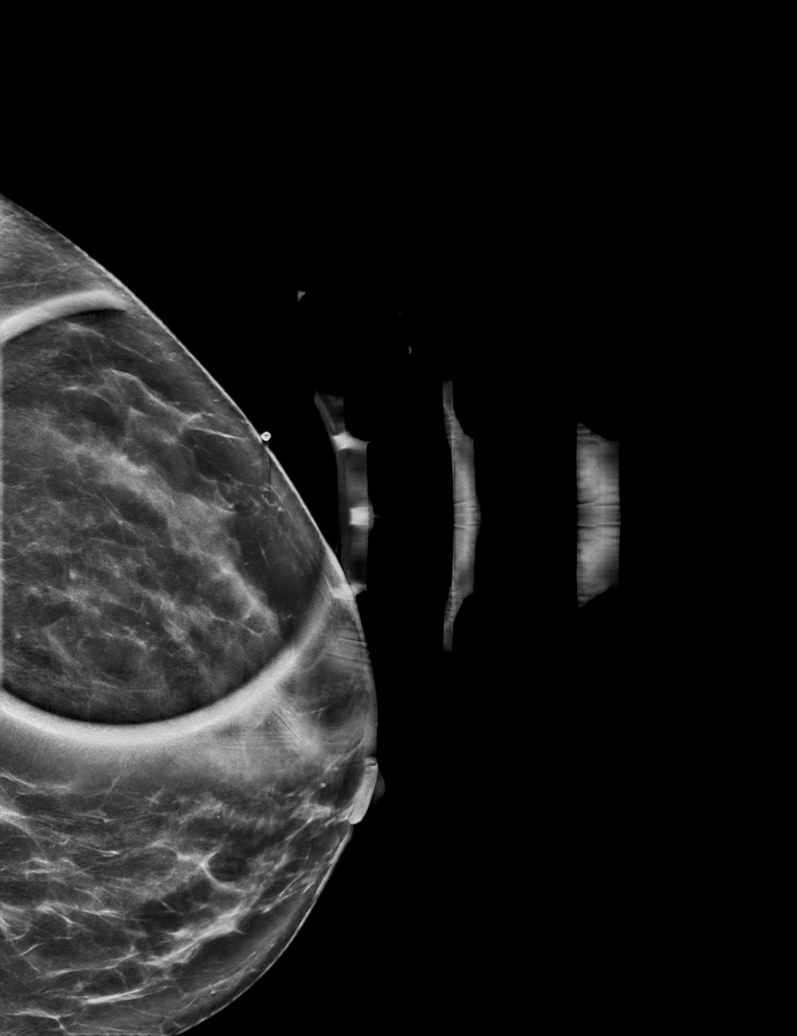

[R MLO synth-2D]
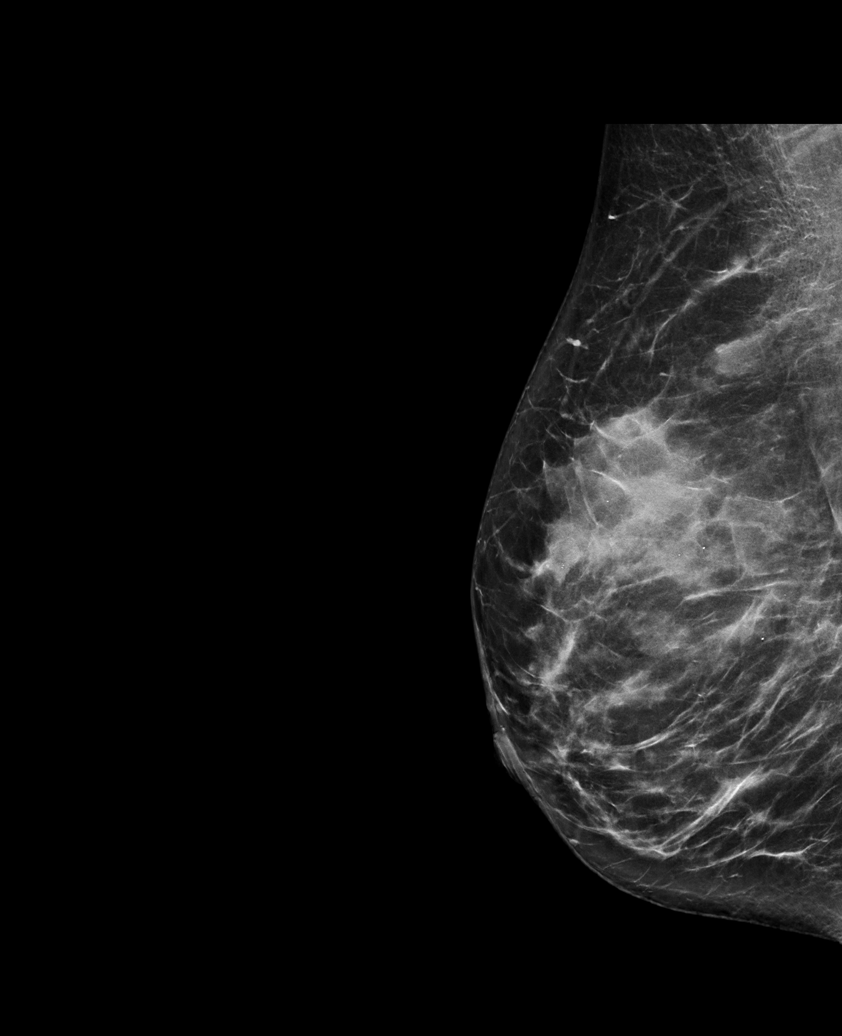

[L MLO synth-2D]
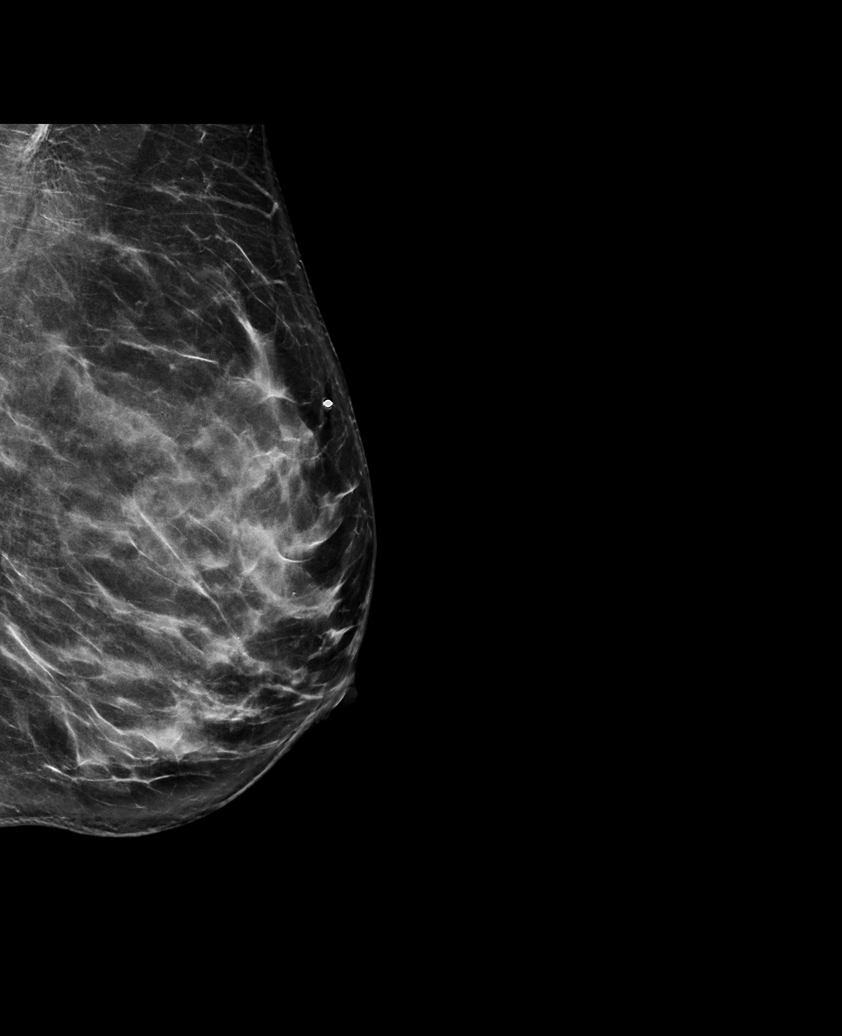

[R CC synth-2D]
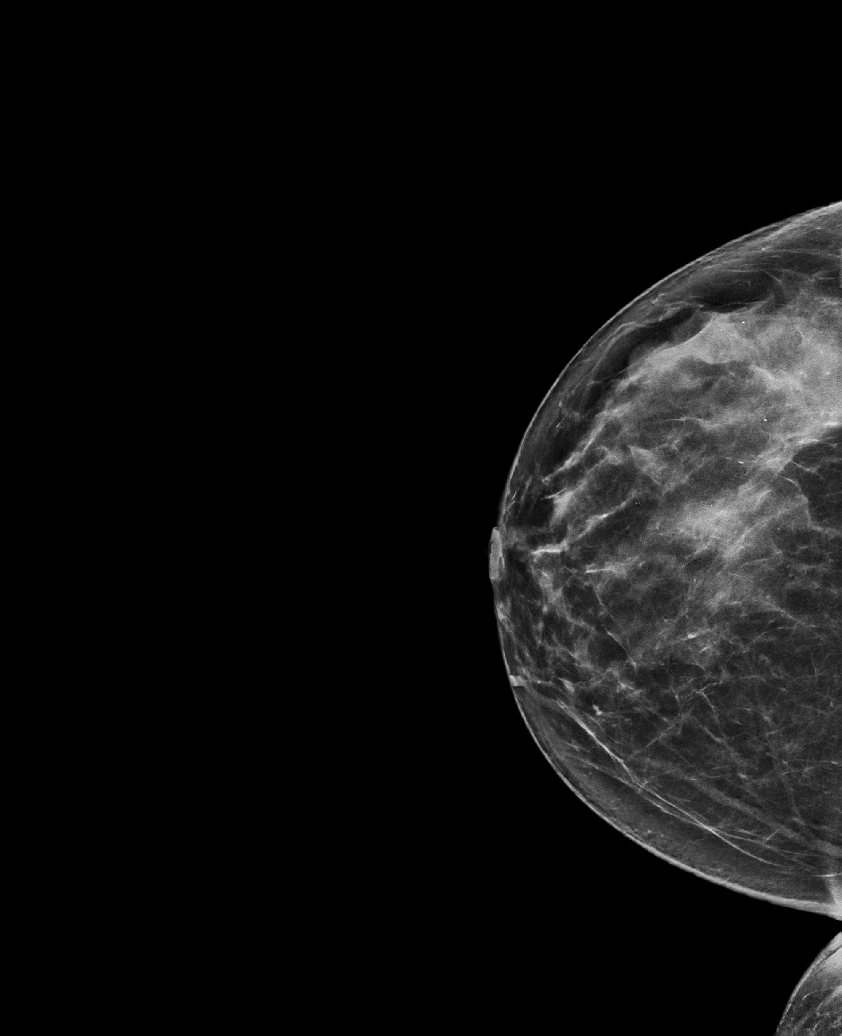

[6 of 36 positions shown; findings below may reference images not displayed]

ACR Breast Density Category c: The breast tissue is heterogeneously
dense, which may obscure small masses.
FINDINGS: 2D/3D full field views of both breasts and spot compression view of
the LEFT breast demonstrate no suspicious mass, distortion or
worrisome calcifications.

A circumscribed oval mass within the OUTER RIGHT breast and
circumscribed oval mass within the UPPER-OUTER LEFT breast are again
noted.

Mammographic images were processed with CAD.

Targeted ultrasound is performed, showing a 1.4 x 1.1 x 1.4 cm
benign cyst at the 9 o'clock position of the RIGHT breast 5 cm from
the nipple and a 1.2 x 0.8 x 1.3 cm benign complicated cyst at the 1
o'clock position of the LEFT breast 5 cm from the nipple are noted
and correspond to the patient's palpable abnormalities.
IMPRESSION: Benign cyst within the OUTER RIGHT breast and benign cyst within the
UPPER-OUTER LEFT breast corresponding to the palpable abnormalities.

No mammographic evidence of breast malignancy.

RECOMMENDATION:
Bilateral screening mammogram in 1 year.

I have discussed the findings and recommendations with the patient.
If applicable, a reminder letter will be sent to the patient
regarding the next appointment.

BI-RADS CATEGORY  2: Benign.

## 2021-07-22 ENCOUNTER — Other Ambulatory Visit: Payer: Self-pay | Admitting: Obstetrics and Gynecology

## 2021-07-22 DIAGNOSIS — Z1231 Encounter for screening mammogram for malignant neoplasm of breast: Secondary | ICD-10-CM

## 2021-09-26 ENCOUNTER — Ambulatory Visit
Admission: RE | Admit: 2021-09-26 | Discharge: 2021-09-26 | Disposition: A | Discharge status: Home / Self Care | Payer: Managed Care, Other (non HMO) | Source: Ambulatory Visit | Attending: Obstetrics and Gynecology | Admitting: Obstetrics and Gynecology

## 2021-09-26 ENCOUNTER — Other Ambulatory Visit: Payer: Self-pay

## 2021-09-26 DIAGNOSIS — Z1231 Encounter for screening mammogram for malignant neoplasm of breast: Secondary | ICD-10-CM | POA: Diagnosis present

## 2022-08-03 ENCOUNTER — Other Ambulatory Visit: Payer: Self-pay | Admitting: Obstetrics and Gynecology

## 2022-08-03 DIAGNOSIS — Z1231 Encounter for screening mammogram for malignant neoplasm of breast: Secondary | ICD-10-CM

## 2022-09-30 ENCOUNTER — Ambulatory Visit
Admission: RE | Admit: 2022-09-30 | Discharge: 2022-09-30 | Disposition: A | Discharge status: Home / Self Care | Payer: Managed Care, Other (non HMO) | Source: Ambulatory Visit | Attending: Obstetrics and Gynecology | Admitting: Obstetrics and Gynecology

## 2022-09-30 DIAGNOSIS — Z1231 Encounter for screening mammogram for malignant neoplasm of breast: Secondary | ICD-10-CM

## 2023-09-23 ENCOUNTER — Other Ambulatory Visit: Payer: Self-pay | Admitting: Obstetrics and Gynecology

## 2023-09-23 DIAGNOSIS — Z1231 Encounter for screening mammogram for malignant neoplasm of breast: Secondary | ICD-10-CM

## 2023-10-11 ENCOUNTER — Ambulatory Visit
Admission: RE | Admit: 2023-10-11 | Discharge: 2023-10-11 | Disposition: A | Discharge status: Home / Self Care | Payer: Managed Care, Other (non HMO) | Source: Ambulatory Visit | Attending: Obstetrics and Gynecology | Admitting: Obstetrics and Gynecology

## 2023-10-11 DIAGNOSIS — Z1231 Encounter for screening mammogram for malignant neoplasm of breast: Secondary | ICD-10-CM | POA: Diagnosis present

## 2024-04-10 NOTE — Progress Notes (Signed)
 Chief Complaint  Patient presents with  . Annual Exam    Physical     HPI  MELA PERHAM is a 53 y.o. here for an Annual Physical  Rt shoulder pain has not been bad  Occasionally feels anxious- States Xanax  helps  Has lost approx 18 lbs in weight ( On Zepbound)  Non Smoker (has been exposed to tobacco growing up (worked in [employer] and dad smoked a pipe)  Pt states her memory has no been good  - no worsening Hx of Left hip replacement in Nov 2020 - sees Ortho and has periodic bursitis- Sees Dr. 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 Total Cholesterol: 175 and Triglycerides; 78 Vit D ; 48.3        Outpatient Encounter Medications as of 04/10/2024  Medication Sig Dispense Refill  . ALPRAZolam  (XANAX ) 0.5 MG tablet Take 1 tablet (0.5 mg total) by mouth as needed 30 tablet 3  . estradioL (ESTRACE) 0.01 % (0.1 mg/gram) vaginal cream INSERT FINGERTIP UNIT AMOUNT (0.5g) VAGINALLY TWICE WEEKLY FOR MAINTENANCE 42.5 g 1  . irbesartan  (AVAPRO ) 75 MG tablet TAKE 1 TABLET(75 MG) BY MOUTH AT BEDTIME 90 tablet 1  . ZEPBOUND 5 mg/0.5 mL pen injector ADMINISTER 5 MG UNDER THE SKIN EVERY WEEK.     No facility-administered encounter medications on file as of 04/10/2024.    Allergies as of 04/10/2024 - Reviewed 10/07/2023  Allergen Reaction Noted  . Pramipexole Vomiting 11/01/2015    Past Medical History:  Diagnosis Date  . Anxiety   . Hyperlipidemia, mixed 11/07/2019  . Hypertension   . Migraines   . Osteoarthritis    Left Hip    Past Surgical History:  Procedure Laterality Date  . HYSTERECTOMY  04/20/2016   Total vaginal hysterectomy, Bilateral salpingectomy   . ARTHROPLASTY HIP TOTAL Left 07/25/2019   Dr. Kathlynn  . COLONOSCOPY  06/12/2021   Tubular adenoma/Repeat 20yrs/[facility]  . BLADDER SURGERY    . Bladder SUSPENSION SURGERY    . CESAREAN SECTION    .  JOINT REPLACEMENT    . THERMAL ABLATION ENDOMETRIUM     Dr. Janit  . TUBAL LIGATION          Exam Blood pressure 120/80, pulse 62, height 170.2 cm (5' 7), weight 66.2 kg (146 lb), last menstrual period 02/05/2016, SpO2 99%.  Wt Readings from Last 3 Encounters:  04/10/24 66.2 kg (146 lb)  10/07/23 74.4 kg (164 lb)  08/25/23 75.8 kg (167 lb 3.2 oz)    General.   VS reviewed  NAD Eyes. Sclera and conjunctiva clear; Vision grossly intact; extraocular movements intact Oropharynx. No suspicious lesions Neck. Supple. No swelling, masses, thyroid normal size, no masses palpated.  Lungs. Respirations unlabored; clear to auscultation bilaterally Cardiovascular. Heart regular rate and rhythm without murmurs, gallops, or rubs Abdomen: Soft Non tender  . No masses felt PELVIC; Sees GYN  Skin. Normal color and turgor Extremities; No edema  Neurologic. Alert and oriented x3; CN 2-12 grossly intact; no focal deficits  Assessment and Plan: 1 HTN Stable:On Irbesartan  75 mg po qd  2 Generalized Anxiety/ Depression  Doing better Has come off Prozac  Occasionally takes Xanax   3 OA Left hip; Underwent successful  replacement surgery  4 Pain in left upper arm; Improved - No longer on Meloxicam  5 Unintended weight gain; On Zepbound 5  mg s/q q weekly  Monitor 6 Plantar Fasciitis Rt foot; Improved  7 Health maintenance: Up to date with  Flu shot and Inj Tdap  Pap , Pelvic and Mammogram  with GYN- Sees Dr. Verdon- Pap Done 07/31/22 (un satisfactory specimen)   Colonoscopy: Sept 2022- 1 polyp in ileocecal valve  Mammogram; Jan 2025; Negative  Check cbc, met-c, Lipids, u/a and TSH, Se Magnesium , B12, Vit D  and se ferritin   with [facility] 1 week prior to next visit  Follow up in 6 months      Tamra Leventhal  MD

## 2024-04-19 ENCOUNTER — Other Ambulatory Visit: Payer: Self-pay

## 2024-04-19 ENCOUNTER — Emergency Department

## 2024-04-19 ENCOUNTER — Emergency Department
Admission: EM | Admit: 2024-04-19 | Discharge: 2024-04-19 | Disposition: A | Discharge status: Home / Self Care | Source: Ambulatory Visit | Attending: Emergency Medicine | Admitting: Emergency Medicine

## 2024-04-19 DIAGNOSIS — G510 Bell's palsy: Secondary | ICD-10-CM

## 2024-04-19 DIAGNOSIS — R202 Paresthesia of skin: Secondary | ICD-10-CM | POA: Diagnosis not present

## 2024-04-19 DIAGNOSIS — R29810 Facial weakness: Secondary | ICD-10-CM | POA: Diagnosis not present

## 2024-04-19 DIAGNOSIS — I10 Essential (primary) hypertension: Secondary | ICD-10-CM | POA: Diagnosis not present

## 2024-04-19 DIAGNOSIS — R519 Headache, unspecified: Secondary | ICD-10-CM | POA: Diagnosis not present

## 2024-04-19 DIAGNOSIS — R200 Anesthesia of skin: Secondary | ICD-10-CM | POA: Diagnosis present

## 2024-04-19 LAB — CBC
HCT: 40.9 % (ref 36.0–46.0)
Hemoglobin: 13.5 g/dL (ref 12.0–15.0)
MCH: 28.7 pg (ref 26.0–34.0)
MCHC: 33 g/dL (ref 30.0–36.0)
MCV: 87 fL (ref 80.0–100.0)
Platelets: 320 K/uL (ref 150–400)
RBC: 4.7 MIL/uL (ref 3.87–5.11)
RDW: 11.9 % (ref 11.5–15.5)
WBC: 6.6 K/uL (ref 4.0–10.5)
nRBC: 0 % (ref 0.0–0.2)

## 2024-04-19 LAB — COMPREHENSIVE METABOLIC PANEL WITH GFR
ALT: 13 U/L (ref 0–44)
AST: 22 U/L (ref 15–41)
Albumin: 4.4 g/dL (ref 3.5–5.0)
Alkaline Phosphatase: 49 U/L (ref 38–126)
Anion gap: 8 (ref 5–15)
BUN: 15 mg/dL (ref 6–20)
CO2: 25 mmol/L (ref 22–32)
Calcium: 9.5 mg/dL (ref 8.9–10.3)
Chloride: 105 mmol/L (ref 98–111)
Creatinine, Ser: 0.74 mg/dL (ref 0.44–1.00)
GFR, Estimated: >60 mL/min (ref >60)
Glucose, Bld: 98 mg/dL (ref 70–99)
Potassium: 3.6 mmol/L (ref 3.5–5.1)
Sodium: 138 mmol/L (ref 135–145)
Total Bilirubin: 0.6 mg/dL (ref 0.0–1.2)
Total Protein: 8.1 g/dL (ref 6.5–8.1)

## 2024-04-19 LAB — DIFFERENTIAL
Abs Immature Granulocytes: 0.02 K/uL (ref 0.00–0.07)
Basophils Absolute: 0 K/uL (ref 0.0–0.1)
Basophils Relative: 1 %
Eosinophils Absolute: 0.1 K/uL (ref 0.0–0.5)
Eosinophils Relative: 1 %
Immature Granulocytes: 0 %
Lymphocytes Relative: 30 %
Lymphs Abs: 2 K/uL (ref 0.7–4.0)
Monocytes Absolute: 0.5 K/uL (ref 0.1–1.0)
Monocytes Relative: 8 %
Neutro Abs: 4 K/uL (ref 1.7–7.7)
Neutrophils Relative %: 60 %

## 2024-04-19 LAB — ETHANOL: Alcohol, Ethyl (B): <15 mg/dL (ref <15)

## 2024-04-19 LAB — CBG MONITORING, ED: Glucose-Capillary: 81 mg/dL (ref 70–99)

## 2024-04-19 LAB — PROTIME-INR
INR: 1 (ref 0.8–1.2)
Prothrombin Time: 13.4 s (ref 11.4–15.2)

## 2024-04-19 LAB — APTT: aPTT: 34 s (ref 24–36)

## 2024-04-19 MED ORDER — DIPHENHYDRAMINE HCL 50 MG/ML IJ SOLN
25.0000 mg | Freq: Once | INTRAMUSCULAR | Status: AC
  Administered 2024-04-19: 25 mg via INTRAVENOUS
  Filled 2024-04-19: qty 1

## 2024-04-19 MED ORDER — KETOROLAC TROMETHAMINE 15 MG/ML IJ SOLN
15.0000 mg | Freq: Once | INTRAMUSCULAR | Status: AC
  Administered 2024-04-19: 15 mg via INTRAVENOUS
  Filled 2024-04-19: qty 1

## 2024-04-19 MED ORDER — SODIUM CHLORIDE 0.9 % IV BOLUS
1000.0000 mL | Freq: Once | INTRAVENOUS | Status: AC
  Administered 2024-04-19: 1000 mL via INTRAVENOUS

## 2024-04-19 MED ORDER — GADOBUTROL 1 MMOL/ML IV SOLN
6.0000 mL | Freq: Once | INTRAVENOUS | Status: AC | PRN
  Administered 2024-04-19: 6 mL via INTRAVENOUS

## 2024-04-19 MED ORDER — PREDNISONE 20 MG PO TABS
60.0000 mg | ORAL_TABLET | Freq: Once | ORAL | Status: AC
  Administered 2024-04-19: 60 mg via ORAL
  Filled 2024-04-19: qty 3

## 2024-04-19 MED ORDER — PREDNISONE 10 MG (21) PO TBPK: ORAL_TABLET | ORAL | 0 refills | Status: AC

## 2024-04-19 MED ORDER — PROCHLORPERAZINE EDISYLATE 10 MG/2ML IJ SOLN
10.0000 mg | Freq: Once | INTRAMUSCULAR | Status: AC
  Administered 2024-04-19: 10 mg via INTRAVENOUS
  Filled 2024-04-19: qty 2

## 2024-04-19 NOTE — ED Provider Notes (Signed)
 [facility] Provider Note    Event Date/Time   First MD Initiated Contact with Patient 04/19/24 1155     (approximate)   History   Numbness   HPI  Grace Moreno is a 53 year old female with history of hypertension presenting to the emergency department for evaluation of facial weakness and numbness.  Around 9 PM last night, she noticed that she had weakness of the right side of her face including her forehead as well as numbness of her tongue.  When she woke up this morning around 630 her symptoms were worse.  She additionally reports that over the past couple days she has had intermittent episodes of pins-and-needles sensation in her right hand and foot.  She does report a right sided headache.  Has a distant history of migraines.      Physical Exam   Triage Vital Signs: ED Triage Vitals  Encounter Vitals Group     BP 04/19/24 1122 (!) 167/90     Girls Systolic BP Percentile --      Girls Diastolic BP Percentile --      Boys Systolic BP Percentile --      Boys Diastolic BP Percentile --      Pulse Rate 04/19/24 1122 60     Resp 04/19/24 1122 18     Temp 04/19/24 1122 98.7 F (37.1 C)     Temp Source 04/19/24 1122 Oral     SpO2 04/19/24 1122 100 %     Weight 04/19/24 1123 145 lb (65.8 kg)     Height 04/19/24 1123 5' 7 (1.702 m)     Head Circumference --      Peak Flow --      Pain Score 04/19/24 1122 3     Pain Loc --      Pain Education --      Exclude from Growth Chart --     Most recent vital signs: Vitals:   04/19/24 1122 04/19/24 1405  BP: (!) 167/90 (!) 152/91  Pulse: 60   Resp: 18   Temp: 98.7 F (37.1 C)   SpO2: 100%      General: Awake, interactive  CV:  Regular rate, good peripheral perfusion.  Resp:  Unlabored respirations Abd:  Nondistended.  Neuro:   Keenly aware, correctly answers month and age, able to blink eyes and squeeze hands, normal horizontal extraocular movements, no visual field loss, mild right facial  droop present.  Patient is able to elevate her eyebrows but it does seem slightly weaker on the right compared to the contralateral side.  Eye closure also seems likely weaker than contralateral side. No arm or leg motor drift, no limb ataxia, normal sensation, no aphasia, no dysarthria, no inattention.     ED Results / Procedures / Treatments   Labs (all labs ordered are listed, but only abnormal results are displayed) Labs Reviewed  PROTIME-INR  APTT  CBC  DIFFERENTIAL  COMPREHENSIVE METABOLIC PANEL WITH GFR  ETHANOL  CBG MONITORING, ED  I-STAT CREATININE, ED     EKG EKG independently reviewed and interpreted by myself demonstrates:    RADIOLOGY Imaging independently reviewed and interpreted by myself demonstrates:  CT head without acute bleed  Formal Radiology Read:  CT HEAD WO CONTRAST Result Date: 04/19/2024 CLINICAL DATA:  53 year old female with right facial droop and numbness. Right side head pressure. EXAM: CT HEAD WITHOUT CONTRAST TECHNIQUE: Contiguous axial images were obtained from the base of the skull through the vertex without  intravenous contrast. RADIATION DOSE REDUCTION: This exam was performed according to the departmental dose-optimization program which includes automated exposure control, adjustment of the mA and/or kV according to patient size and/or use of iterative reconstruction technique. COMPARISON:  None Available. FINDINGS: Brain: Scaphocephaly, normal variant. No midline shift, ventriculomegaly, mass effect, evidence of mass lesion, intracranial hemorrhage or evidence of cortically based acute infarction. Gray-white matter differentiation is within normal limits throughout the brain. Vascular: No suspicious intracranial vascular hyperdensity. Skull: Intact, negative. Sinuses/Orbits: Visualized paranasal sinuses and mastoids are clear. Tympanic cavities are clear. Other: Visualized orbits and scalp soft tissues are within normal limits. IMPRESSION: Normal  noncontrast Head CT. Electronically Signed   By: VEAR Hurst M.D.   On: 04/19/2024 11:55    PROCEDURES:  Critical Care performed: No  Procedures   MEDICATIONS ORDERED IN ED: Medications  gadobutrol  (GADAVIST ) 1 MMOL/ML injection 6 mL (has no administration in time range)  ketorolac  (TORADOL ) 15 MG/ML injection 15 mg (15 mg Intravenous Given 04/19/24 1355)  diphenhydrAMINE  (BENADRYL ) injection 25 mg (25 mg Intravenous Given 04/19/24 1349)  prochlorperazine  (COMPAZINE ) injection 10 mg (10 mg Intravenous Given 04/19/24 1351)  sodium chloride  0.9 % bolus 1,000 mL (1,000 mLs Intravenous New Bag/Given 04/19/24 1357)     IMPRESSION / MDM / ASSESSMENT AND PLAN / ED COURSE  I reviewed the triage vital signs and the nursing notes.  Differential diagnosis includes, but is not limited to, Bell's palsy, complex migraine, CVA, TIA  Patient's presentation is most consistent with acute presentation with potential threat to life or bodily function.  53 year old female presenting with facial weakness and unilateral extremity paresthesias.  Stable vitals on presentation.  Reassuring labs.  Head CT from triage without acute findings.  Patient's exam seems most consistent with a Bell's palsy given her right sided facial weakness with involvement of her forehead, but with her unilateral paresthesias in her right hand and foot, consideration for alternative pathology.  Discussed with Dr. Matthews with neurology who recommended MRI of the brain with and without contrast.  Signed out to oncoming physician pending completion of this.  If this is reassuring, suspect patient will be appropriate for discharge with treatment for Bell's palsy.      FINAL CLINICAL IMPRESSION(S) / ED DIAGNOSES   Final diagnoses:  Facial droop  Paresthesias     Rx / DC Orders   ED Discharge Orders     None        Note:  This document was prepared using Dragon voice recognition software and may include unintentional dictation  errors.   Levander Slate, MD 04/19/24 [phone]

## 2024-04-19 NOTE — ED Triage Notes (Signed)
 Patient to ED via POV for right sided facial numbness/droop. Pt reports she first noticed symptoms while brushing her teeth last night before going to bed around 2130. Upon waking this AM 0630 pt states symptoms appeared worse. Right side of teeth and tongue feel numb. Denies weakness. Pt reports right sided headache (pressure behind eye) since Monday with the right sided teeth numbness. Still having right sided headache today. Ambulatory to triage.

## 2024-10-26 ENCOUNTER — Other Ambulatory Visit: Payer: Self-pay | Admitting: Obstetrics and Gynecology

## 2024-10-26 DIAGNOSIS — Z1231 Encounter for screening mammogram for malignant neoplasm of breast: Secondary | ICD-10-CM

## 2024-11-13 ENCOUNTER — Encounter
